# Patient Record
Sex: Female | Born: 1945 | Race: White | Hispanic: No | Smoking: Never smoker
Health system: Southern US, Community
[De-identification: ages and names within clinical notes are randomized; demographics above are authoritative.]

## PROBLEM LIST (undated history)

## (undated) DIAGNOSIS — S82851A Displaced trimalleolar fracture of right lower leg, initial encounter for closed fracture: Secondary | ICD-10-CM

## (undated) HISTORY — PX: ROOT CANAL: SHX2363

---

## 1999-05-06 ENCOUNTER — Other Ambulatory Visit: Admission: RE | Admit: 1999-05-06 | Discharge: 1999-05-06 | Payer: Self-pay | Admitting: Obstetrics & Gynecology

## 2016-07-24 ENCOUNTER — Ambulatory Visit (INDEPENDENT_AMBULATORY_CARE_PROVIDER_SITE_OTHER): Payer: BC Managed Care – PPO

## 2016-07-24 ENCOUNTER — Ambulatory Visit (HOSPITAL_COMMUNITY)
Admission: EM | Admit: 2016-07-24 | Discharge: 2016-07-24 | Disposition: A | Discharge status: Home / Self Care | Payer: BC Managed Care – PPO | Attending: Family Medicine | Admitting: Family Medicine

## 2016-07-24 ENCOUNTER — Encounter (HOSPITAL_COMMUNITY): Payer: Self-pay | Admitting: *Deleted

## 2016-07-24 DIAGNOSIS — S82851A Displaced trimalleolar fracture of right lower leg, initial encounter for closed fracture: Secondary | ICD-10-CM

## 2016-07-24 DIAGNOSIS — S82841A Displaced bimalleolar fracture of right lower leg, initial encounter for closed fracture: Secondary | ICD-10-CM | POA: Diagnosis not present

## 2016-07-24 DIAGNOSIS — M25571 Pain in right ankle and joints of right foot: Secondary | ICD-10-CM

## 2016-07-24 NOTE — ED Notes (Signed)
Patient has 2 + pedal pulses, able to move toes

## 2016-07-24 NOTE — ED Notes (Signed)
Provided propping up of extremity and applied ice pack to right ankle

## 2016-07-24 NOTE — ED Notes (Signed)
Ortho has arrived 

## 2016-07-24 NOTE — ED Notes (Signed)
Ortho has finished.

## 2016-07-24 NOTE — ED Triage Notes (Signed)
Patient reports injuring left ankle Thursday night, used cold compression and elevation last night, woke up this am with large amount of swelling and bruising to right ankle up into right calf and right foot.

## 2016-07-24 NOTE — Discharge Instructions (Signed)
Call [facility] at [phone] on Monday. A referral was placed through the urgent care. I spoke with Dr. Dion SaucierLandau and he is expecting you.   Continue Tylenol and Motrin.  Ice is your friend.  Use crutches for getting around, try to avoid bearing weight.

## 2016-07-24 NOTE — ED Notes (Signed)
Paged Ortho... States he is heading for a trauma first and will be here after he finishes... Notified pt.

## 2016-07-24 NOTE — ED Provider Notes (Signed)
[facility]    CSN: [id number] Arrival date & time: 07/24/16  1302     History   Chief Complaint Chief Complaint  Patient presents with  . Ankle Pain    HPI Sarah Daniels is a 71 y.o. female.   HPI The patient presents with right ankle pain. 2 days ago, she was in her room and slipped on her hardwood floor. She hit the outside of her right ankle on the metal bed frame. She had some initial pain, however was able to sleep. Since that time, she has had continued bruising and swelling. She is able to walk with a limp. She has been using aspirin and Tylenol for pain control. She is having no issues sleeping. She has no numbness or tingling.  History reviewed. No pertinent past medical history.  History reviewed. No pertinent surgical history.   Home Medications    Takes no medications routinely.  Family History History reviewed. No pertinent family history.  Social History Social History  Substance Use Topics  . Smoking status: Never Smoker  . Smokeless tobacco: Never Used  . Alcohol use Yes     Comment: occ     Allergies   Patient has no known allergies.   Review of Systems Review of Systems  Neurological: Negative for numbness.  MSK: +R ankle pain and swelling   Physical Exam Triage Vital Signs ED Triage Vitals  Enc Vitals Group     BP 07/24/16 1354 (!) 149/89     Pulse Rate 07/24/16 1354 93     Resp 07/24/16 1354 14     Temp 07/24/16 1354 99 F (37.2 C)     Temp Source 07/24/16 1354 Oral     SpO2 07/24/16 1354 95 %     Pain Score 07/24/16 1349 8   Updated Vital Signs BP (!) 149/89 (BP Location: Left Arm) Comment: notified rn  Pulse 93   Temp 99 F (37.2 C) (Oral)   Resp 14   SpO2 95%   Physical Exam  Constitutional: She is oriented to person, place, and time. She appears well-developed and well-nourished.  HENT:  Head: Normocephalic and atraumatic.  Nose: Nose normal.  Eyes: EOM are normal. Pupils are equal, round, and  reactive to light.  Cardiovascular: Normal rate, regular rhythm and intact distal pulses.   No murmur heard. Pulmonary/Chest: Effort normal and breath sounds normal. No respiratory distress.  Neurological: She is alert and oriented to person, place, and time. No sensory deficit.  Skin: Skin is warm and dry. Capillary refill takes less than 2 seconds. No erythema.  Psychiatric: She has a normal mood and affect. Judgment normal.  MSK: TTP over medial and lateral malleolus, +soft tissue swelling about the ankle, +decreased ROM, antalgic gait   UC Treatments / Results  Radiology Dg Ankle Complete Right  Result Date: 07/24/2016 CLINICAL DATA:  Slipped on hardwood floor hitting ankle and foot against bed frame, now with pain and swelling. EXAM: RIGHT ANKLE - COMPLETE 3+ VIEW COMPARISON:  None. FINDINGS: Minimally displaced trimalleolar ankle fractures, all of which appear to demonstrate intra-articular extension. No significant displacement. Small expected joint effusion. Expected adjacent soft tissue swelling. No radiopaque foreign body. Note is made is of a small os peroneus. IMPRESSION: Minimally displaced trimalleolar ankle fracture with intra-articular extension. Electronically Signed   By: Simonne Come M.D.   On: 07/24/2016 14:14    Procedures Procedures - none  Initial Impression / Assessment and Plan / UC Course  I have  reviewed the triage vital signs and the nursing notes.  Pertinent labs & imaging results that were available during my care of the patient were reviewed by me and considered in my medical decision making (see chart for details).     71 year old female presents with a right ankle injury. X-ray as noted above. She was placed in a posterior splint and given crutches. I checked the splint personally and ensured that she is still neurovascularly intact and the splint is not too tight. I did speak with the on-call orthopedic physician, Dr. Dion SaucierLandau, of [facility].  He will be in the office on Monday and will see the patient. The referral has been placed today. The number and hours were given to the patient and after visit summary. I offered stronger pain medication, however she states that she is content with aspirin and Tylenol. Encourage continued use of icing. Try to avoid bearing weight. Follow-up if new symptoms arise. The patient voiced understanding and agreement to the plan.  Final Clinical Impressions(s) / UC Diagnoses   Final diagnoses:  Closed trimalleolar fracture of right ankle, initial encounter      Sharlene Doryicholas Paul Wendling, DO 07/24/16 1702

## 2016-07-24 NOTE — Progress Notes (Signed)
Orthopedic Tech Progress Note Patient Details:  Sarah Daniels Nov 05, 1945 [id number]  Ortho Devices Type of Ortho Device: Ace wrap, Crutches, Post (short leg) splint Ortho Device/Splint Location: rle Ortho Device/Splint Interventions: Application   [name] 07/24/2016, 3:47 PM

## 2016-07-26 ENCOUNTER — Other Ambulatory Visit: Payer: Self-pay | Admitting: Orthopedic Surgery

## 2016-07-26 ENCOUNTER — Encounter (HOSPITAL_BASED_OUTPATIENT_CLINIC_OR_DEPARTMENT_OTHER): Payer: Self-pay | Admitting: *Deleted

## 2016-07-28 ENCOUNTER — Encounter (HOSPITAL_BASED_OUTPATIENT_CLINIC_OR_DEPARTMENT_OTHER)
Admission: RE | Disposition: A | Discharge status: Home / Self Care | Payer: Self-pay | Source: Ambulatory Visit | Attending: Orthopedic Surgery

## 2016-07-28 ENCOUNTER — Ambulatory Visit (HOSPITAL_BASED_OUTPATIENT_CLINIC_OR_DEPARTMENT_OTHER): Payer: BC Managed Care – PPO | Admitting: Anesthesiology

## 2016-07-28 ENCOUNTER — Ambulatory Visit (HOSPITAL_BASED_OUTPATIENT_CLINIC_OR_DEPARTMENT_OTHER)
Admission: RE | Admit: 2016-07-28 | Discharge: 2016-07-29 | Disposition: A | Discharge status: Home / Self Care | Payer: BC Managed Care – PPO | Source: Ambulatory Visit | Attending: Orthopedic Surgery | Admitting: Orthopedic Surgery

## 2016-07-28 ENCOUNTER — Encounter (HOSPITAL_BASED_OUTPATIENT_CLINIC_OR_DEPARTMENT_OTHER): Payer: Self-pay | Admitting: Anesthesiology

## 2016-07-28 DIAGNOSIS — W010XXA Fall on same level from slipping, tripping and stumbling without subsequent striking against object, initial encounter: Secondary | ICD-10-CM | POA: Diagnosis not present

## 2016-07-28 DIAGNOSIS — S82851A Displaced trimalleolar fracture of right lower leg, initial encounter for closed fracture: Secondary | ICD-10-CM | POA: Diagnosis present

## 2016-07-28 DIAGNOSIS — Z7982 Long term (current) use of aspirin: Secondary | ICD-10-CM | POA: Insufficient documentation

## 2016-07-28 HISTORY — PX: ORIF ANKLE FRACTURE: SHX5408

## 2016-07-28 HISTORY — DX: Displaced trimalleolar fracture of right lower leg, initial encounter for closed fracture: S82.851A

## 2016-07-28 SURGERY — OPEN REDUCTION INTERNAL FIXATION (ORIF) ANKLE FRACTURE
Anesthesia: Monitor Anesthesia Care | Site: Ankle | Laterality: Right

## 2016-07-28 MED ORDER — LIDOCAINE 2% (20 MG/ML) 5 ML SYRINGE
INTRAMUSCULAR | Status: AC
  Filled 2016-07-28: qty 5

## 2016-07-28 MED ORDER — PROMETHAZINE HCL 25 MG/ML IJ SOLN: 6.2500 mg | INTRAMUSCULAR | Status: DC | PRN

## 2016-07-28 MED ORDER — ONDANSETRON HCL 4 MG/2ML IJ SOLN
INTRAMUSCULAR | Status: DC | PRN
  Administered 2016-07-28: 4 mg via INTRAVENOUS

## 2016-07-28 MED ORDER — OXYCODONE HCL 5 MG PO TABS: 5.0000 mg | ORAL_TABLET | ORAL | Status: DC | PRN

## 2016-07-28 MED ORDER — SENNA-DOCUSATE SODIUM 8.6-50 MG PO TABS: 2.0000 | ORAL_TABLET | Freq: Every day | ORAL | 1 refills | Status: DC

## 2016-07-28 MED ORDER — PROPOFOL 500 MG/50ML IV EMUL
INTRAVENOUS | Status: AC
  Filled 2016-07-28: qty 50

## 2016-07-28 MED ORDER — MIDAZOLAM HCL 2 MG/2ML IJ SOLN
1.0000 mg | INTRAMUSCULAR | Status: DC | PRN
  Administered 2016-07-28: 2 mg via INTRAVENOUS

## 2016-07-28 MED ORDER — POLYETHYLENE GLYCOL 3350 17 G PO PACK: 17.0000 g | PACK | Freq: Every day | ORAL | Status: DC | PRN

## 2016-07-28 MED ORDER — SODIUM CHLORIDE 0.9 % IV SOLN
INTRAVENOUS | Status: DC
  Administered 2016-07-28: 15:00:00 via INTRAVENOUS

## 2016-07-28 MED ORDER — HYDROMORPHONE HCL 1 MG/ML IJ SOLN: 0.5000 mg | INTRAMUSCULAR | Status: DC | PRN

## 2016-07-28 MED ORDER — MIDAZOLAM HCL 2 MG/2ML IJ SOLN
INTRAMUSCULAR | Status: AC
  Filled 2016-07-28: qty 2

## 2016-07-28 MED ORDER — SENNA 8.6 MG PO TABS
1.0000 | ORAL_TABLET | Freq: Two times a day (BID) | ORAL | Status: DC
Start: 2016-07-28 — End: 2016-07-29
  Administered 2016-07-28: 8.6 mg via ORAL
  Filled 2016-07-28: qty 1

## 2016-07-28 MED ORDER — SCOPOLAMINE 1 MG/3DAYS TD PT72: 1.0000 | MEDICATED_PATCH | Freq: Once | TRANSDERMAL | Status: DC | PRN

## 2016-07-28 MED ORDER — LIDOCAINE HCL (CARDIAC) 20 MG/ML IV SOLN
INTRAVENOUS | Status: DC | PRN
  Administered 2016-07-28: 10 mg via INTRAVENOUS

## 2016-07-28 MED ORDER — BISACODYL 10 MG RE SUPP: 10.0000 mg | Freq: Every day | RECTAL | Status: DC | PRN

## 2016-07-28 MED ORDER — OXYCODONE-ACETAMINOPHEN 5-325 MG PO TABS: 1.0000 | ORAL_TABLET | ORAL | Status: DC | PRN

## 2016-07-28 MED ORDER — CEFAZOLIN SODIUM-DEXTROSE 2-4 GM/100ML-% IV SOLN
2.0000 g | INTRAVENOUS | Status: AC
  Administered 2016-07-28: 2 g via INTRAVENOUS

## 2016-07-28 MED ORDER — BUPIVACAINE HCL (PF) 0.5 % IJ SOLN
INTRAMUSCULAR | Status: AC
  Filled 2016-07-28: qty 30

## 2016-07-28 MED ORDER — ONDANSETRON HCL 4 MG PO TABS: 4.0000 mg | ORAL_TABLET | Freq: Four times a day (QID) | ORAL | Status: DC | PRN

## 2016-07-28 MED ORDER — FENTANYL CITRATE (PF) 100 MCG/2ML IJ SOLN
INTRAMUSCULAR | Status: AC
  Filled 2016-07-28: qty 2

## 2016-07-28 MED ORDER — LACTATED RINGERS IV SOLN
INTRAVENOUS | Status: DC
  Administered 2016-07-28 (×2): via INTRAVENOUS

## 2016-07-28 MED ORDER — DEXAMETHASONE SODIUM PHOSPHATE 10 MG/ML IJ SOLN
INTRAMUSCULAR | Status: AC
  Filled 2016-07-28: qty 1

## 2016-07-28 MED ORDER — DOCUSATE SODIUM 100 MG PO CAPS
100.0000 mg | ORAL_CAPSULE | Freq: Two times a day (BID) | ORAL | Status: DC
  Administered 2016-07-28: 100 mg via ORAL
  Filled 2016-07-28: qty 1

## 2016-07-28 MED ORDER — CEFAZOLIN IN D5W 1 GM/50ML IV SOLN
1.0000 g | Freq: Four times a day (QID) | INTRAVENOUS | Status: AC
  Administered 2016-07-28 – 2016-07-29 (×3): 1 g via INTRAVENOUS
  Filled 2016-07-28 (×3): qty 50

## 2016-07-28 MED ORDER — PROPOFOL 10 MG/ML IV BOLUS
INTRAVENOUS | Status: AC
  Filled 2016-07-28: qty 20

## 2016-07-28 MED ORDER — ONDANSETRON HCL 4 MG PO TABS: 4.0000 mg | ORAL_TABLET | Freq: Three times a day (TID) | ORAL | 0 refills | Status: DC | PRN

## 2016-07-28 MED ORDER — MIDAZOLAM HCL 5 MG/5ML IJ SOLN
INTRAMUSCULAR | Status: DC | PRN
  Administered 2016-07-28: 2 mg via INTRAVENOUS

## 2016-07-28 MED ORDER — METHOCARBAMOL 1000 MG/10ML IJ SOLN: 500.0000 mg | Freq: Four times a day (QID) | INTRAVENOUS | Status: DC | PRN

## 2016-07-28 MED ORDER — PROPOFOL 10 MG/ML IV BOLUS
INTRAVENOUS | Status: DC | PRN
  Administered 2016-07-28 (×4): 30 mg via INTRAVENOUS

## 2016-07-28 MED ORDER — MAGNESIUM CITRATE PO SOLN
1.0000 | Freq: Once | ORAL | Status: DC | PRN
Start: 2016-07-28 — End: 2016-07-29

## 2016-07-28 MED ORDER — ONDANSETRON HCL 4 MG/2ML IJ SOLN
INTRAMUSCULAR | Status: AC
  Filled 2016-07-28: qty 2

## 2016-07-28 MED ORDER — METOCLOPRAMIDE HCL 5 MG/ML IJ SOLN: 5.0000 mg | Freq: Three times a day (TID) | INTRAMUSCULAR | Status: DC | PRN

## 2016-07-28 MED ORDER — METHOCARBAMOL 500 MG PO TABS: 500.0000 mg | ORAL_TABLET | Freq: Four times a day (QID) | ORAL | Status: DC | PRN

## 2016-07-28 MED ORDER — FENTANYL CITRATE (PF) 100 MCG/2ML IJ SOLN
50.0000 ug | INTRAMUSCULAR | Status: DC | PRN
  Administered 2016-07-28: 100 ug via INTRAVENOUS

## 2016-07-28 MED ORDER — ONDANSETRON HCL 4 MG/2ML IJ SOLN: 4.0000 mg | Freq: Four times a day (QID) | INTRAMUSCULAR | Status: DC | PRN

## 2016-07-28 MED ORDER — 0.9 % SODIUM CHLORIDE (POUR BTL) OPTIME
TOPICAL | Status: DC | PRN
  Administered 2016-07-28: 1000 mL

## 2016-07-28 MED ORDER — HYDROMORPHONE HCL 1 MG/ML IJ SOLN: 0.2500 mg | INTRAMUSCULAR | Status: DC | PRN

## 2016-07-28 MED ORDER — FENTANYL CITRATE (PF) 100 MCG/2ML IJ SOLN
INTRAMUSCULAR | Status: DC | PRN
  Administered 2016-07-28 (×2): 50 ug via INTRAVENOUS
  Administered 2016-07-28 (×2): 25 ug via INTRAVENOUS

## 2016-07-28 MED ORDER — PROPOFOL 500 MG/50ML IV EMUL
INTRAVENOUS | Status: DC | PRN
  Administered 2016-07-28: 25 ug/kg/min via INTRAVENOUS

## 2016-07-28 MED ORDER — BUPIVACAINE-EPINEPHRINE (PF) 0.5% -1:200000 IJ SOLN
INTRAMUSCULAR | Status: DC | PRN
  Administered 2016-07-28: 15 mL via PERINEURAL
  Administered 2016-07-28: 25 mL via PERINEURAL

## 2016-07-28 MED ORDER — OXYCODONE-ACETAMINOPHEN 5-325 MG PO TABS: 1.0000 | ORAL_TABLET | Freq: Four times a day (QID) | ORAL | 0 refills | Status: DC | PRN

## 2016-07-28 MED ORDER — METOCLOPRAMIDE HCL 5 MG PO TABS: 5.0000 mg | ORAL_TABLET | Freq: Three times a day (TID) | ORAL | Status: DC | PRN

## 2016-07-28 MED ORDER — ZOLPIDEM TARTRATE 5 MG PO TABS: 5.0000 mg | ORAL_TABLET | Freq: Every evening | ORAL | Status: DC | PRN

## 2016-07-28 MED ORDER — CEFAZOLIN SODIUM-DEXTROSE 2-4 GM/100ML-% IV SOLN
INTRAVENOUS | Status: AC
  Filled 2016-07-28: qty 100

## 2016-07-28 SURGICAL SUPPLY — 79 items
4.0 X 40 CANNULATED SCREW (Screw) ×2 IMPLANT
BANDAGE ACE 4X5 VEL STRL LF (GAUZE/BANDAGES/DRESSINGS) ×3 IMPLANT
BANDAGE ACE 6X5 VEL STRL LF (GAUZE/BANDAGES/DRESSINGS) ×3 IMPLANT
BANDAGE ESMARK 6X9 LF (GAUZE/BANDAGES/DRESSINGS) ×1 IMPLANT
BIT DRILL 110X2.5XQCK CNCT (BIT) ×1 IMPLANT
BIT DRILL 2.5 (BIT) ×3
BIT DRILL 2.7XCANN QCK CNCT (BIT) IMPLANT
BIT DRILL CANN 2.7 (BIT) ×2
BIT DRILL CANN 2.7MM (BIT) ×1
BIT DRL 110X2.5XQCK CNCT (BIT) ×1
BIT DRL 2.7XCANN QCK CNCT (BIT) ×1
BLADE SURG 15 STRL LF DISP TIS (BLADE) ×3 IMPLANT
BLADE SURG 15 STRL SS (BLADE) ×6
BNDG CMPR 9X6 STRL LF SNTH (GAUZE/BANDAGES/DRESSINGS) ×1
BNDG COHESIVE 4X5 TAN STRL (GAUZE/BANDAGES/DRESSINGS) ×3 IMPLANT
BNDG ESMARK 6X9 LF (GAUZE/BANDAGES/DRESSINGS) ×3
CANISTER SUCT 1200ML W/VALVE (MISCELLANEOUS) ×3 IMPLANT
CLOSURE STERI-STRIP 1/2X4 (GAUZE/BANDAGES/DRESSINGS) ×1
CLSR STERI-STRIP ANTIMIC 1/2X4 (GAUZE/BANDAGES/DRESSINGS) ×1 IMPLANT
COVER BACK TABLE 60X90IN (DRAPES) ×3 IMPLANT
CUFF TOURNIQUET SINGLE 34IN LL (TOURNIQUET CUFF) ×2 IMPLANT
DECANTER SPIKE VIAL GLASS SM (MISCELLANEOUS) IMPLANT
DRAPE C-ARM 42X72 X-RAY (DRAPES) IMPLANT
DRAPE C-ARMOR (DRAPES) IMPLANT
DRAPE EXTREMITY T 121X128X90 (DRAPE) ×3 IMPLANT
DRAPE IMP U-DRAPE 54X76 (DRAPES) ×3 IMPLANT
DRAPE INCISE IOBAN 66X45 STRL (DRAPES) ×3 IMPLANT
DRAPE OEC MINIVIEW 54X84 (DRAPES) ×2 IMPLANT
DRAPE U-SHAPE 47X51 STRL (DRAPES) ×3 IMPLANT
DRSG ADAPTIC 3X8 NADH LF (GAUZE/BANDAGES/DRESSINGS) ×3 IMPLANT
DRSG PAD ABDOMINAL 8X10 ST (GAUZE/BANDAGES/DRESSINGS) ×2 IMPLANT
DURAPREP 26ML APPLICATOR (WOUND CARE) ×3 IMPLANT
ELECT REM PT RETURN 9FT ADLT (ELECTROSURGICAL) ×3
ELECTRODE REM PT RTRN 9FT ADLT (ELECTROSURGICAL) ×1 IMPLANT
GAUZE SPONGE 4X4 12PLY STRL (GAUZE/BANDAGES/DRESSINGS) ×1 IMPLANT
GLOVE BIO SURGEON STRL SZ7 (GLOVE) ×2 IMPLANT
GLOVE BIO SURGEON STRL SZ8 (GLOVE) ×3 IMPLANT
GLOVE BIOGEL M 6.5 STRL (GLOVE) ×2 IMPLANT
GLOVE BIOGEL PI IND STRL 8 (GLOVE) ×2 IMPLANT
GLOVE BIOGEL PI INDICATOR 8 (GLOVE) ×6
GLOVE ORTHO TXT STRL SZ7.5 (GLOVE) ×3 IMPLANT
GOWN STRL REUS W/ TWL LRG LVL3 (GOWN DISPOSABLE) ×1 IMPLANT
GOWN STRL REUS W/ TWL XL LVL3 (GOWN DISPOSABLE) ×2 IMPLANT
GOWN STRL REUS W/TWL LRG LVL3 (GOWN DISPOSABLE) ×3
GOWN STRL REUS W/TWL XL LVL3 (GOWN DISPOSABLE) ×6
GUIDEWIRE PIN ORTH 6X1.6XSMTH (WIRE) ×2 IMPLANT
K-WIRE 1.6 (WIRE) ×6
NEEDLE HYPO 25X1 1.5 SAFETY (NEEDLE) IMPLANT
NS IRRIG 1000ML POUR BTL (IV SOLUTION) ×3 IMPLANT
PACK BASIN DAY SURGERY FS (CUSTOM PROCEDURE TRAY) ×3 IMPLANT
PAD CAST 4YDX4 CTTN HI CHSV (CAST SUPPLIES) ×2 IMPLANT
PADDING CAST COTTON 4X4 STRL (CAST SUPPLIES)
PENCIL BUTTON HOLSTER BLD 10FT (ELECTRODE) ×3 IMPLANT
SCREW CANN 1/3 THRD RVRS CT (Screw) IMPLANT
SCREW CANNULATED 4.0X40 (Screw) ×3 IMPLANT
SCREW CORTICAL 3.5X26 (Screw) ×2 IMPLANT
SHEET MEDIUM DRAPE 40X70 STRL (DRAPES) IMPLANT
SLEEVE SCD COMPRESS KNEE MED (MISCELLANEOUS) ×3 IMPLANT
SPLINT FAST PLASTER 5X30 (CAST SUPPLIES)
SPLINT PLASTER CAST FAST 5X30 (CAST SUPPLIES) IMPLANT
SPONGE LAP 4X18 X RAY DECT (DISPOSABLE) ×3 IMPLANT
STAPLER VISISTAT 35W (STAPLE) IMPLANT
SUCTION FRAZIER HANDLE 10FR (MISCELLANEOUS) ×2
SUCTION TUBE FRAZIER 10FR DISP (MISCELLANEOUS) ×1 IMPLANT
SUT ETHILON 3 0 PS 1 (SUTURE) IMPLANT
SUT ETHILON 4 0 PS 2 18 (SUTURE) IMPLANT
SUT MNCRL AB 4-0 PS2 18 (SUTURE) IMPLANT
SUT VIC AB 0 CT1 27 (SUTURE)
SUT VIC AB 0 CT1 27XBRD ANBCTR (SUTURE) IMPLANT
SUT VIC AB 2-0 SH 18 (SUTURE) IMPLANT
SUT VIC AB 3-0 SH 27 (SUTURE)
SUT VIC AB 3-0 SH 27X BRD (SUTURE) IMPLANT
SUT VICRYL 3-0 CR8 SH (SUTURE) IMPLANT
SYR BULB 3OZ (MISCELLANEOUS) ×3 IMPLANT
SYR CONTROL 10ML LL (SYRINGE) IMPLANT
TUBE CONNECTING 20'X1/4 (TUBING) ×1
TUBE CONNECTING 20X1/4 (TUBING) ×2 IMPLANT
UNDERPAD 30X30 (UNDERPADS AND DIAPERS) ×3 IMPLANT
YANKAUER SUCT BULB TIP NO VENT (SUCTIONS) ×3 IMPLANT

## 2016-07-28 NOTE — Op Note (Signed)
07/28/2016  PATIENT:  Sarah Daniels    PRE-OPERATIVE DIAGNOSIS:  Right trimalleolar fracture  POST-OPERATIVE DIAGNOSIS:  Same  PROCEDURE:  OPEN REDUCTION INTERNAL FIXATION (ORIF) RIGHT TRIMALLEOLAR FRACTURE WITHOUT FIXATION OF THE POSTERIOR LIP  X-ray evaluation of right ankle, 3 views with an intraoperative stress view: These demonstrate anatomic alignment with stable syndesmosis and fixation of the fibula and medial malleolus.  SURGEON:  Eulas Post, MD  PHYSICIAN ASSISTANT: Janace Litten, OPA-C, present and scrubbed throughout the case, critical for completion in a timely fashion, and for retraction, instrumentation, and closure.  ANESTHESIA:   Regional  ESTIMATED BLOOD LOSS: Minimal  PREOPERATIVE INDICATIONS:  Sarah Daniels is a  71 y.o. female with a diagnosis of Right trimalleolar fracture who elected for surgical management to minimize the risk for malunion and nonunion and post-traumatic arthritis.    The risks benefits and alternatives were discussed with the patient preoperatively including but not limited to the risks of infection, bleeding, nerve injury, cardiopulmonary complications, the need for revision surgery, the need for hardware removal, among others, and the patient was willing to proceed.  OPERATIVE IMPLANTS: 4.0 mm cannulated screw with a washer size 60 mm for the fibula with a 26 mm interfragmentary lag screw and a single 4.0 mm cannulated screw for the medial malleolus.  OPERATIVE PROCEDURE: The patient was brought to the operating room and placed in the supine position. All bony prominences were padded. Regional anesthesia was administered with a popliteal and saphenous nerve block. The lower extremity was prepped and draped in the usual sterile fashion. The leg was elevated and exsanguinated and the tourniquet was inflated. Time out was performed.   Incision was made over the distal fibula and the fracture was exposed and reduced anatomically with a  clamp. The bone quality was extremely poor. The fracture was extremely distal. A standard one third tubular plate would not have provided any fixation. I considered using a hook plate, although I was concerned about hardware prominence, additionally there was significant comminution at the fibular fracture, and I elected to utilize a cancellus partially threaded screw with a washer up the fibula. This had satisfactory fixation, the bone quality was extremely poor. I confirmed anatomic reduction by C-arm and direct visualization. There was some posterior comminution as well which was ignored.  I then turned my attention to the medial malleolus. Incision was made over the medial malleolus and the fracture exposed and held provisionally with a clamp. 2 guidepins were placed for the 4.0 mm cannulated screws and then confirmation of reduction was made with fluoroscopy. I then placed the anterior screw, which was in the best quality bone going up the anterior column, again there was a fair amount of comminution at the fracture site and poor quality bone. I inspected the posterior guidepin, but it was actually penetrating the posterior tibial tendon which was immediately adjacent to the bone, and therefore I abandoned placing a posterior screw, there really was not much room for a second screw, and I had already placed the anterior screw in the best bone possible..   The syndesmosis was stressed using live fluoroscopy and found to be stable.   The wounds were irrigated, and closed with vicryl with routine closure for the skin. Sterile gauze was applied followed by a posterior splint. She was awakened and returned to the PACU in stable and satisfactory condition. There were no complications. We have had a in-depth discussions about the need to be nonweightbearing, although she has effectively declined  the use of a knee scooter, and says that she is not capable of being nonweightbearing, and we will just have to simply  do the best we can. We will admit her overnight for pain control and monitoring and hopefully crutch education/walker education to minimize weightbearing, which could potentially lead to failure of the construct.

## 2016-07-28 NOTE — H&P (Signed)
PREOPERATIVE H&P  Chief Complaint: right trimalleolar fracture  HPI: Sarah Daniels is a 71 y.o. female who presents for preoperative history and physical with a diagnosis of right trimalleolar fracture. Symptoms are rated as moderate to severe, and have been worsening.  This is significantly impairing activities of daily living.  She has elected for surgical management.   Injury was last Thursday. Slipped on hardwood floor.  Using tylenol and aspirin for pain.   Past Medical History:  Diagnosis Date  . Trimalleolar fracture of ankle, closed, right, initial encounter    Past Surgical History:  Procedure Laterality Date  . ROOT CANAL     Social History   Social History  . Marital status: Unknown    Spouse name: N/A  . Number of children: N/A  . Years of education: N/A   Social History Main Topics  . Smoking status: Never Smoker  . Smokeless tobacco: Never Used  . Alcohol use Yes     Comment: drinks wine daily with dinner  . Drug use: No  . Sexual activity: Not Asked   Other Topics Concern  . None   Social History Narrative  . None   History reviewed. No pertinent family history. Allergies  Allergen Reactions  . Carbocaine [Mepivacaine Hcl] Hives    As a child (thinks it was hives but not sure)   Prior to Admission medications   Medication Sig Start Date End Date Taking? Authorizing Provider  aspirin 325 MG tablet Take 325 mg by mouth daily.   Yes Historical Provider, MD  acetaminophen (TYLENOL) 325 MG tablet Take 650 mg by mouth every 6 (six) hours as needed.    Historical Provider, MD     Positive ROS: All other systems have been reviewed and were otherwise negative with the exception of those mentioned in the HPI and as above.  Physical Exam: General: Alert, no acute distress Cardiovascular: No pedal edema Respiratory: No cyanosis, no use of accessory musculature GI: No organomegaly, abdomen is soft and non-tender Skin: No lesions in the area of chief  complaint Neurologic: Sensation intact distally Psychiatric: Patient is competent for consent with normal mood and affect Lymphatic: No axillary or cervical lymphadenopathy  MUSCULOSKELETAL: right ankle has some soft tissue swelling, pain to palpation diffusely, EHL intact. Sensation intact.  Assessment: right trimalleolar fracture   Plan: Plan for Procedure(s): OPEN REDUCTION INTERNAL FIXATION (ORIF) RIGHT TRIMALLEOLAR FRACTURE without fixation of posterior lip.    The risks benefits and alternatives were discussed with the patient including but not limited to the risks of nonoperative treatment, versus surgical intervention including infection, bleeding, nerve injury, malunion, nonunion, the need for revision surgery, hardware prominence, hardware failure, the need for hardware removal, blood clots, cardiopulmonary complications, morbidity, mortality, among others, and they were willing to proceed.     Eulas PostLANDAU,Anilah Huck P, MD Cell [phone]   07/28/2016 12:32 PM

## 2016-07-28 NOTE — Anesthesia Preprocedure Evaluation (Addendum)
Anesthesia Evaluation  Patient identified by MRN, date of birth, ID band Patient awake    Reviewed: Allergy & Precautions, NPO status , Patient's Chart, lab work & pertinent test results  History of Anesthesia Complications Negative for: history of anesthetic complications  Airway Mallampati: II  TM Distance: >3 FB Neck ROM: Full    Dental no notable dental hx. (+) Dental Advisory Given   Pulmonary neg pulmonary ROS,    Pulmonary exam normal        Cardiovascular negative cardio ROS Normal cardiovascular exam     Neuro/Psych negative neurological ROS  negative psych ROS   GI/Hepatic negative GI ROS, Neg liver ROS,   Endo/Other  negative endocrine ROS  Renal/GU negative Renal ROS  negative genitourinary   Musculoskeletal   Abdominal   Peds negative pediatric ROS (+)  Hematology negative hematology ROS (+)   Anesthesia Other Findings   Reproductive/Obstetrics negative OB ROS                            Anesthesia Physical Anesthesia Plan  ASA: II  Anesthesia Plan: MAC   Post-op Pain Management: GA combined w/ Regional for post-op pain   Induction:   Airway Management Planned: Natural Airway and Simple Face Mask  Additional Equipment:   Intra-op Plan:   Post-operative Plan:   Informed Consent: I have reviewed the patients History and Physical, chart, labs and discussed the procedure including the risks, benefits and alternatives for the proposed anesthesia with the patient or authorized representative who has indicated his/her understanding and acceptance.   Dental advisory given  Plan Discussed with: CRNA, Anesthesiologist and Surgeon  Anesthesia Plan Comments:        Anesthesia Quick Evaluation

## 2016-07-28 NOTE — Transfer of Care (Signed)
Immediate Anesthesia Transfer of Care Note  Patient: Sarah Daniels  Procedure(s) Performed: Procedure(s): OPEN REDUCTION INTERNAL FIXATION (ORIF) RIGHT TRIMALLEOLAR FRACTURE WITHOUT FIXATION OF THE POSTERIOR LIP (Right)  Patient Location: PACU  Anesthesia Type:MAC combined with regional for post-op pain  Level of Consciousness: awake and patient cooperative  Airway & Oxygen Therapy: Patient Spontanous Breathing and Patient connected to face mask oxygen  Post-op Assessment: Report given to RN and Post -op Vital signs reviewed and stable  Post vital signs: Reviewed and stable  Last Vitals:  Vitals:   07/28/16 1230 07/28/16 1436  BP: 139/76 137/76  Pulse: 96 87  Resp: 17 15  Temp:      Last Pain:  Vitals:   07/28/16 1436  TempSrc:   PainSc: (P) 0-No pain      Patients Stated Pain Goal: 2 (46/50/35 4656)  Complications: No apparent anesthesia complications

## 2016-07-28 NOTE — Progress Notes (Signed)
Assisted Dr. Krista BlueSinger with right, ultrasound guided, adductor canal and popliteal block. Side rails up, monitors on throughout procedure. See vital signs in flow sheet. Tolerated Procedure well.

## 2016-07-28 NOTE — Anesthesia Postprocedure Evaluation (Addendum)
Anesthesia Post Note  Patient: Sarah Daniels  Procedure(s) Performed: Procedure(s) (LRB): OPEN REDUCTION INTERNAL FIXATION (ORIF) RIGHT TRIMALLEOLAR FRACTURE WITHOUT FIXATION OF THE POSTERIOR LIP (Right)  Patient location during evaluation: PACU Anesthesia Type: MAC Level of consciousness: awake and alert Pain management: pain level controlled Vital Signs Assessment: post-procedure vital signs reviewed and stable Respiratory status: spontaneous breathing and respiratory function stable Cardiovascular status: stable Anesthetic complications: no       Last Vitals:  Vitals:   07/28/16 1445 07/28/16 1450  BP: 138/81   Pulse: 87 85  Resp: 14 17  Temp:      Last Pain:  Vitals:   07/28/16 1450  TempSrc:   PainSc: 0-No pain        RLE Motor Response: No movement due to regional block (07/28/16 1450) RLE Sensation: Decreased;No pain (07/28/16 1450)      [place]

## 2016-07-28 NOTE — OR Nursing (Signed)
When this writer went to interview patient, the patient had multiple concerns about postoperative instructions and stated that Dr Dion SaucierLandau had told her she needed to be non-weight bearing.  I reinforced this with her and she stated that she was currently moving around her home with a walker and using a cane sometimes when the layout was not conducive to use of the walker.  She stated that she was "just putting a little weight on [her] heel".  I explained that was not a good alternative because she would be moving her ankle and putting stress on it.  I stated that Dr Dion SaucierLandau was making recommendations to her based on his experience and education.  The patient began crying and stated that she felt as though she was "being scolded".  I told her how sorry I was that she felt that way, and reinforced that I was not trying to scold her or make her feel as though she was a "bad patient".  She covered her face with her hands and began sobbing.  Craige CottaM. Linka, CRNA was present for the majority of the conversation and had discussed with the patient some of the strategies that she employed when she had a similar type of surgery.  At that point, she asked the patient if she needed a moment to herself.  The patient nodded and we stepped out of the room.  Several minutes later, M. Linka went back to check on the patient and after a brief interchange, she and the patient wheeled out of the cubicle for the OR room.  I was giving a report to J. Burroughs at that time and we proceeded to transport the patient to the OR suite, with no further issues.

## 2016-07-28 NOTE — Anesthesia Procedure Notes (Signed)
Anesthesia Regional Block: Popliteal block   Pre-Anesthetic Checklist: ,, timeout performed, Correct Patient, Correct Site, Correct Laterality, Correct Procedure, Correct Position, site marked, Risks and benefits discussed,  Surgical consent,  Pre-op evaluation,  At surgeon's request and post-op pain management  Laterality: Right  Prep: chloraprep       Needles:  Injection technique: Single-shot  Needle Type: Echogenic Stimulator Needle          Additional Needles:   Procedures: ultrasound guided,,,,,,,,  Narrative:  Start time: 07/28/2016 12:14 PM End time: 07/28/2016 12:24 PM Injection made incrementally with aspirations every 5 mL.  Performed by: Personally  Anesthesiologist: Heather RobertsSINGER, Daveda Larock  Additional Notes: A functioning IV was confirmed and monitors were applied.  Sterile prep and drape, hand hygiene and sterile gloves were used.  Negative aspiration and test dose prior to incremental administration of local anesthetic. The patient tolerated the procedure well.Ultrasound  guidance: relevant anatomy identified, needle position confirmed, local anesthetic spread visualized around nerve(s), vascular puncture avoided.  Image printed for medical record. Adductor canal block performed.

## 2016-07-28 NOTE — Discharge Instructions (Signed)
Diet: As you were doing prior to hospitalization   Shower:  May shower but keep the wounds dry, use an occlusive plastic wrap, NO SOAKING IN TUB.  If the bandage gets wet, change with a clean dry gauze.  If you have a splint on, leave the splint in place and keep the splint dry with a plastic bag.  Dressing:  You may change your dressing 3-5 days after surgery, unless you have a splint.  If you have a splint, then just leave the splint in place and we will change your bandages during your first follow-up appointment.    If you had hand or foot surgery, we will plan to remove your stitches in about 2 weeks in the office.  For all other surgeries, there are sticky tapes (steri-strips) on your wounds and all the stitches are absorbable.  Leave the steri-strips in place when changing your dressings, they will peel off with time, usually 2-3 weeks.  Activity:  Increase activity slowly as tolerated, but follow the weight bearing instructions below.  The rules on driving is that you can not be taking narcotics while you drive, and you must feel in control of the vehicle.    Weight Bearing:   Non-weightbearing right lower extremity.    To prevent constipation: you may use a stool softener such as -  Colace (over the counter) 100 mg by mouth twice a day  Drink plenty of fluids (prune juice may be helpful) and high fiber foods Miralax (over the counter) for constipation as needed.    Itching:  If you experience itching with your medications, try taking only a single pain pill, or even half a pain pill at a time.  You may take up to 10 pain pills per day, and you can also use benadryl over the counter for itching or also to help with sleep.   Precautions:  If you experience chest pain or shortness of breath - call 911 immediately for transfer to the hospital emergency department!!  If you develop a fever greater that 101 F, purulent drainage from wound, increased redness or drainage from wound, or calf  pain -- Call the office at [phone]                                                Follow- Up Appointment:  Please call for an appointment to be seen in 2 weeks [place] - [phone]  Regional Anesthesia Blocks  1. Numbness or the inability to move the "blocked" extremity may last from 3-48 hours after placement. The length of time depends on the medication injected and your individual response to the medication. If the numbness is not going away after 48 hours, call your surgeon.  2. The extremity that is blocked will need to be protected until the numbness is gone and the  Strength has returned. Because you cannot feel it, you will need to take extra care to avoid injury. Because it may be weak, you may have difficulty moving it or using it. You may not know what position it is in without looking at it while the block is in effect.  3. For blocks in the legs and feet, returning to weight bearing and walking needs to be done carefully. You will need to wait until the numbness is entirely gone and the strength has returned. You should be  able to move your leg and foot normally before you try and bear weight or walk. You will need someone to be with you when you first try to ensure you do not fall and possibly risk injury.  4. Bruising and tenderness at the needle site are common side effects and will resolve in a few days.  5. Persistent numbness or new problems with movement should be communicated to the surgeon or the [facility] [phone] [facility] [phone]).   Post Anesthesia Home Care Instructions  Activity: Get plenty of rest for the remainder of the day. A responsible individual must stay with you for 24 hours following the procedure.  For the next 24 hours, DO NOT: -Drive a car -Advertising copywriter -Drink alcoholic beverages -Take any medication unless instructed by your physician -Make any legal decisions or sign important  papers.  Meals: Start with liquid foods such as gelatin or soup. Progress to regular foods as tolerated. Avoid greasy, spicy, heavy foods. If nausea and/or vomiting occur, drink only clear liquids until the nausea and/or vomiting subsides. Call your physician if vomiting continues.  Special Instructions/Symptoms: Your throat may feel dry or sore from the anesthesia or the breathing tube placed in your throat during surgery. If this causes discomfort, gargle with warm salt water. The discomfort should disappear within 24 hours.  If you had a scopolamine patch placed behind your ear for the management of post- operative nausea and/or vomiting:  1. The medication in the patch is effective for 72 hours, after which it should be removed.  Wrap patch in a tissue and discard in the trash. Wash hands thoroughly with soap and water. 2. You may remove the patch earlier than 72 hours if you experience unpleasant side effects which may include dry mouth, dizziness or visual disturbances. 3. Avoid touching the patch. Wash your hands with soap and water after contact with the patch.

## 2016-07-29 ENCOUNTER — Encounter (HOSPITAL_BASED_OUTPATIENT_CLINIC_OR_DEPARTMENT_OTHER): Payer: Self-pay | Admitting: Orthopedic Surgery

## 2016-07-29 DIAGNOSIS — S82851A Displaced trimalleolar fracture of right lower leg, initial encounter for closed fracture: Secondary | ICD-10-CM | POA: Diagnosis not present

## 2016-10-09 NOTE — Addendum Note (Signed)
Addendum  created 10/09/16 1101 by Vonette Grosso, MD   Sign clinical note    

## 2018-12-19 IMAGING — DX DG ANKLE COMPLETE 3+V*R*
3 series · 3 of 3 positions shown · non-contrast
Comparison: None.

CLINICAL DATA: Slipped on hardwood floor hitting ankle and foot
against bed frame, now with pain and swelling.

EXAM:
RIGHT ANKLE - COMPLETE 3+ VIEW

[ankle ap]
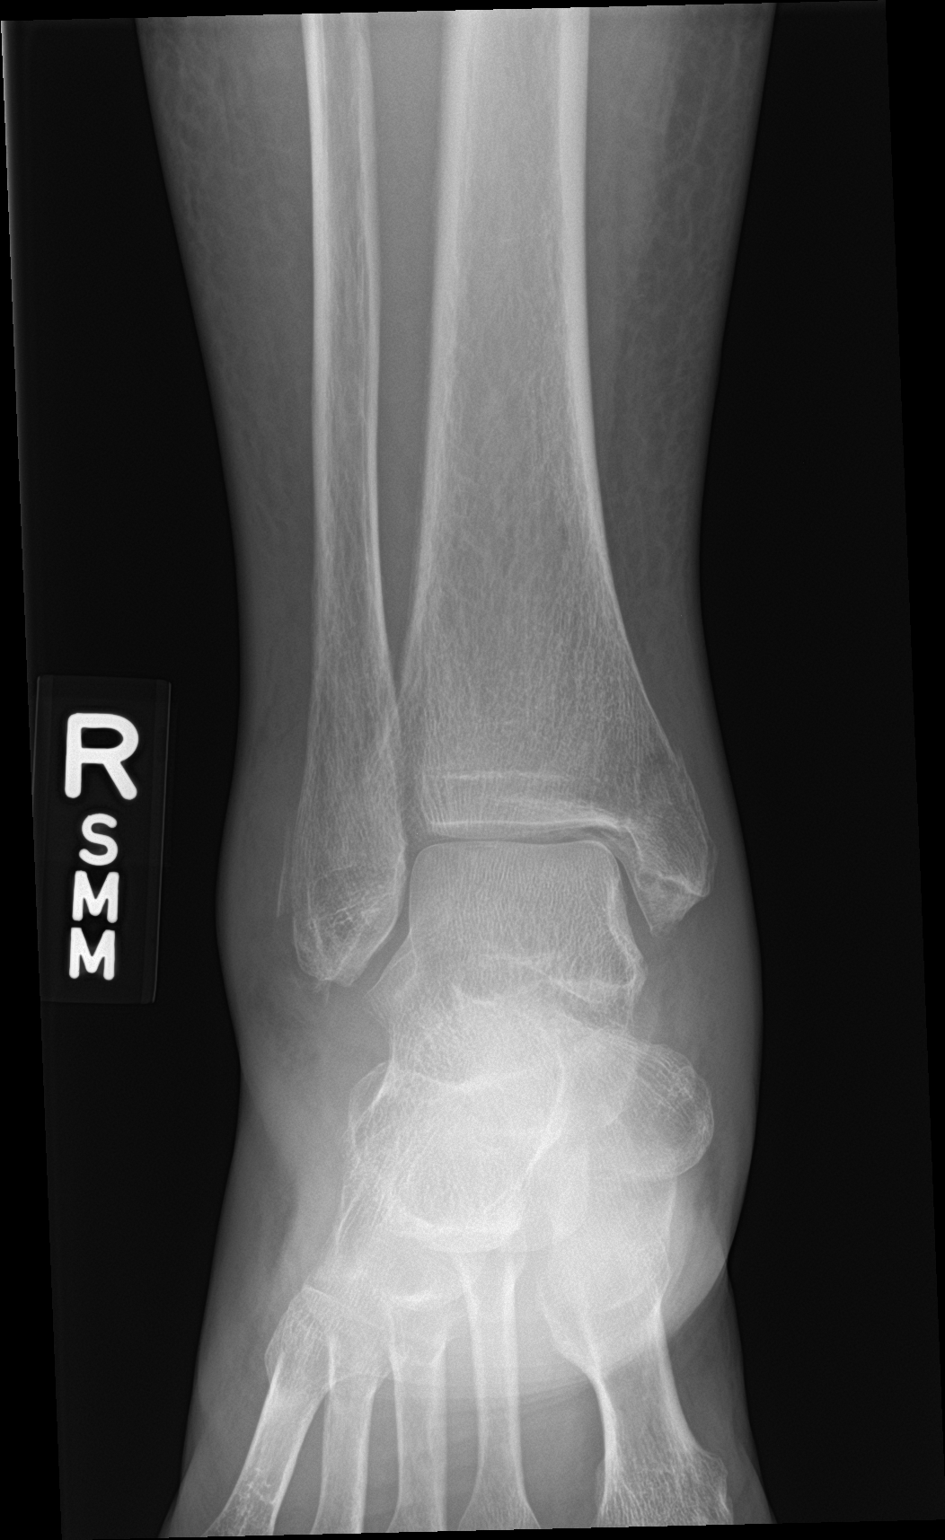

[ankle obl]
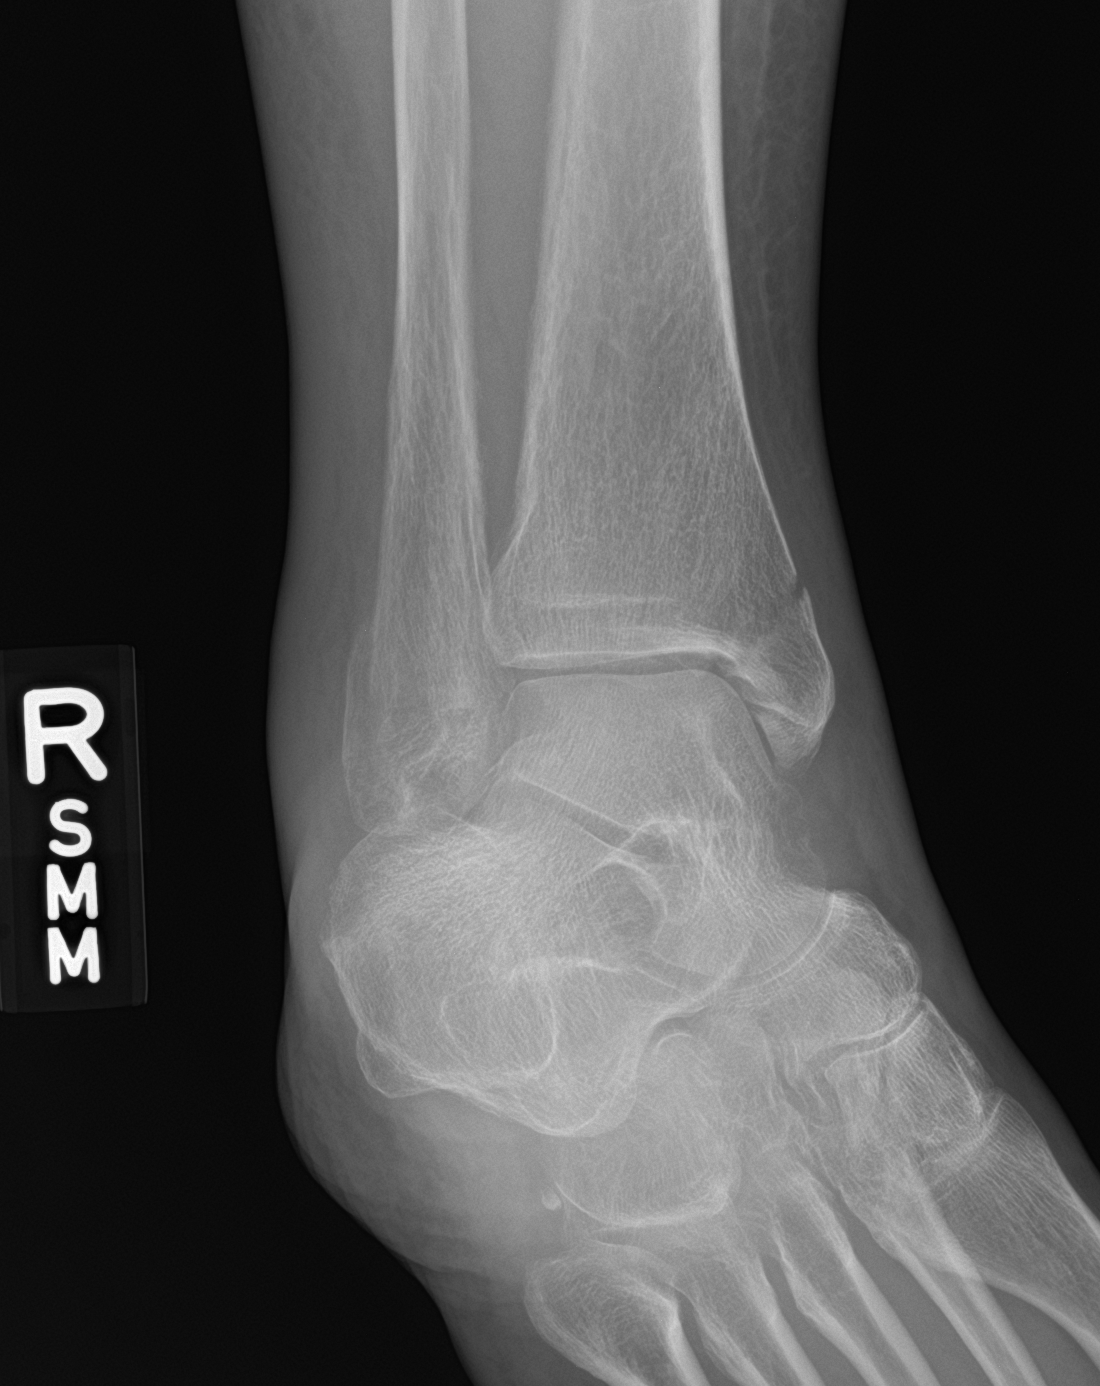

[ankle lat]
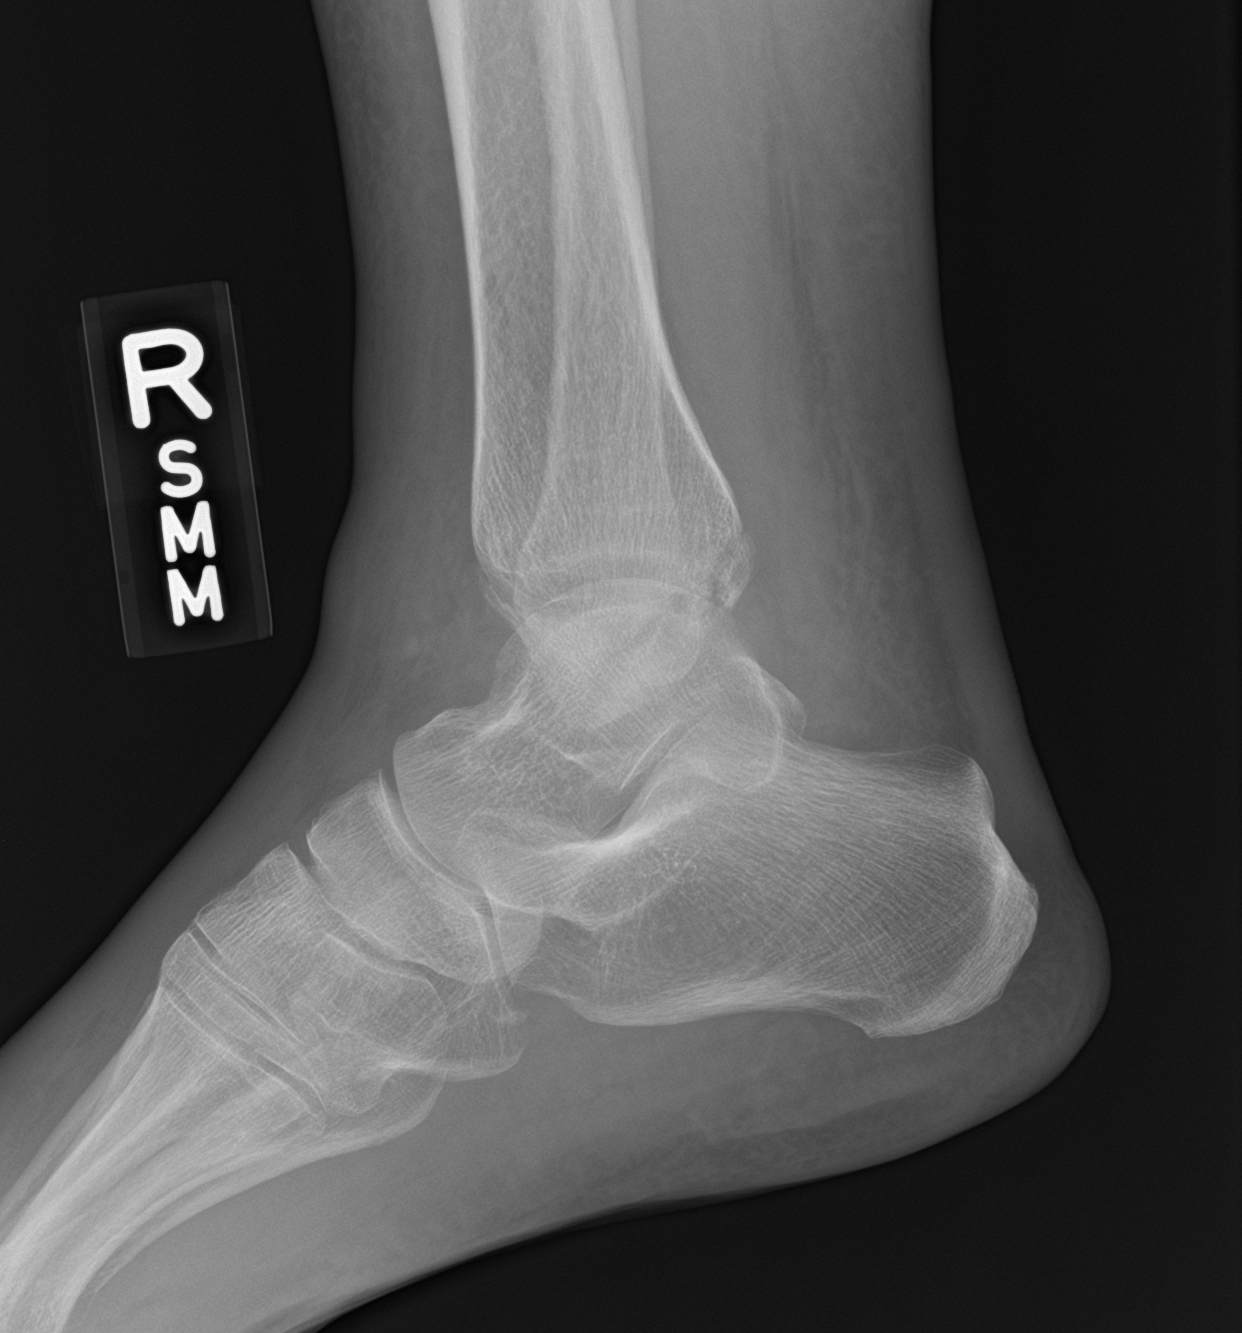

[3 of 3 positions shown; findings below may reference images not displayed]

FINDINGS: Minimally displaced trimalleolar ankle fractures, all of which
appear to demonstrate intra-articular extension. No significant
displacement. Small expected joint effusion. Expected adjacent soft
tissue swelling. No radiopaque foreign body. Note is made is of a
small os peroneus.
IMPRESSION: Minimally displaced trimalleolar ankle fracture with intra-articular
extension.

## 2019-03-30 ENCOUNTER — Other Ambulatory Visit: Payer: Self-pay

## 2019-03-30 ENCOUNTER — Encounter (HOSPITAL_COMMUNITY): Payer: Self-pay

## 2019-03-30 ENCOUNTER — Ambulatory Visit (INDEPENDENT_AMBULATORY_CARE_PROVIDER_SITE_OTHER)
Admission: EM | Admit: 2019-03-30 | Discharge: 2019-03-30 | Disposition: A | Discharge status: Home / Self Care | Payer: BC Managed Care – PPO | Source: Home / Self Care

## 2019-03-30 ENCOUNTER — Encounter (HOSPITAL_COMMUNITY): Payer: Self-pay | Admitting: Emergency Medicine

## 2019-03-30 ENCOUNTER — Emergency Department (HOSPITAL_COMMUNITY): Payer: BC Managed Care – PPO

## 2019-03-30 ENCOUNTER — Emergency Department (HOSPITAL_COMMUNITY)
Admission: EM | Admit: 2019-03-30 | Discharge: 2019-03-31 | Disposition: A | Discharge status: Home / Self Care | Payer: BC Managed Care – PPO | Attending: Emergency Medicine | Admitting: Emergency Medicine

## 2019-03-30 DIAGNOSIS — E8729 Other acidosis: Secondary | ICD-10-CM

## 2019-03-30 DIAGNOSIS — R0789 Other chest pain: Secondary | ICD-10-CM | POA: Insufficient documentation

## 2019-03-30 DIAGNOSIS — R0602 Shortness of breath: Secondary | ICD-10-CM | POA: Diagnosis present

## 2019-03-30 DIAGNOSIS — E872 Acidosis: Secondary | ICD-10-CM | POA: Diagnosis not present

## 2019-03-30 DIAGNOSIS — Z79899 Other long term (current) drug therapy: Secondary | ICD-10-CM | POA: Diagnosis not present

## 2019-03-30 DIAGNOSIS — Z7982 Long term (current) use of aspirin: Secondary | ICD-10-CM | POA: Diagnosis not present

## 2019-03-30 DIAGNOSIS — Z87891 Personal history of nicotine dependence: Secondary | ICD-10-CM | POA: Diagnosis not present

## 2019-03-30 DIAGNOSIS — R110 Nausea: Secondary | ICD-10-CM | POA: Diagnosis not present

## 2019-03-30 DIAGNOSIS — R079 Chest pain, unspecified: Secondary | ICD-10-CM

## 2019-03-30 DIAGNOSIS — R000 Tachycardia, unspecified: Secondary | ICD-10-CM

## 2019-03-30 LAB — CBC
HCT: 41.3 % (ref 36.0–46.0)
Hemoglobin: 13.4 g/dL (ref 12.0–15.0)
MCH: 32 pg (ref 26.0–34.0)
MCHC: 32.4 g/dL (ref 30.0–36.0)
MCV: 98.6 fL (ref 80.0–100.0)
Platelets: 269 10*3/uL (ref 150–400)
RBC: 4.19 MIL/uL (ref 3.87–5.11)
RDW: 13.7 % (ref 11.5–15.5)
WBC: 8.6 10*3/uL (ref 4.0–10.5)
nRBC: 0 % (ref 0.0–0.2)

## 2019-03-30 LAB — BASIC METABOLIC PANEL
Anion gap: 16 — ABNORMAL HIGH (ref 5–15)
BUN: 15 mg/dL (ref 8–23)
CO2: 17 mmol/L — ABNORMAL LOW (ref 22–32)
Calcium: 8.9 mg/dL (ref 8.9–10.3)
Chloride: 103 mmol/L (ref 98–111)
Creatinine, Ser: 0.61 mg/dL (ref 0.44–1.00)
GFR calc Af Amer: >60 mL/min (ref >60)
GFR calc non Af Amer: >60 mL/min (ref >60)
Glucose, Bld: 80 mg/dL (ref 70–99)
Potassium: 3.4 mmol/L — ABNORMAL LOW (ref 3.5–5.1)
Sodium: 136 mmol/L (ref 135–145)

## 2019-03-30 LAB — TROPONIN I (HIGH SENSITIVITY): Troponin I (High Sensitivity): 3 ng/L (ref <18)

## 2019-03-30 MED ORDER — SODIUM CHLORIDE 0.9% FLUSH: 3.0000 mL | Freq: Once | INTRAVENOUS | Status: DC

## 2019-03-30 MED ORDER — SODIUM CHLORIDE 0.9 % IV BOLUS (SEPSIS)
1000.0000 mL | Freq: Once | INTRAVENOUS | Status: AC
  Administered 2019-03-30: 1000 mL via INTRAVENOUS

## 2019-03-30 NOTE — ED Notes (Signed)
Patient is being discharged from the Urgent [place] and sent to the Emergency Department via wheelchair by staff. Per [place], [place], patient is stable but in need of higher level of care due to new onset shortness of breath and tachycardia, EKG shows sinus tachycardia but there is no prior EKG to compare it to. Patient is aware and verbalizes understanding of plan of care.  Vitals:   03/30/19 1916  BP: 136/75  Pulse: (!) 120  Resp: (!) 28  Temp: 98.5 F (36.9 C)  SpO2: 100%

## 2019-03-30 NOTE — ED Provider Notes (Signed)
[facility]   MRN: [id number] DOB: 07/04/1945  Subjective:   Sarah Daniels is a 73 y.o. female presenting for acute onset of worsening severe shortness of breath, heart racing, nausea without vomiting.  Patient states that symptoms started without any warning, known inciting factors.  Denies history of cardiac conditions, history of clot.  Patient has sutures distance well.  Denies known COVID-19 contacts.  Denies taking any chronic medications.  Allergies  Allergen Reactions  . Carbocaine [Mepivacaine Hcl] Hives    As a child (thinks it was hives but not sure)    Past Medical History:  Diagnosis Date  . Trimalleolar fracture of ankle, closed, right, initial encounter      Past Surgical History:  Procedure Laterality Date  . ORIF ANKLE FRACTURE Right 07/28/2016   Procedure: OPEN REDUCTION INTERNAL FIXATION (ORIF) RIGHT TRIMALLEOLAR FRACTURE WITHOUT FIXATION OF THE POSTERIOR LIP;  Surgeon: Teryl Lucy, MD;  Location: [facility];  Service: Orthopedics;  Laterality: Right;  . ROOT CANAL      Family History  Problem Relation Age of Onset  . Healthy Mother   . Healthy Father     Social History   Tobacco Use  . Smoking status: Never Smoker  . Smokeless tobacco: Never Used  Substance Use Topics  . Alcohol use: Yes    Comment: drinks wine daily with dinner  . Drug use: No    Review of Systems  Constitutional: Negative for fever and malaise/fatigue.  HENT: Negative for congestion, ear pain, sinus pain and sore throat.   Eyes: Negative for blurred vision, double vision, discharge and redness.  Respiratory: Positive for shortness of breath. Negative for cough, hemoptysis and wheezing.   Cardiovascular: Negative for chest pain.  Gastrointestinal: Positive for nausea. Negative for abdominal pain, diarrhea and vomiting.  Genitourinary: Negative for dysuria, flank pain and hematuria.  Musculoskeletal: Negative for myalgias.  Skin: Negative for  rash.  Neurological: Positive for dizziness. Negative for speech change, focal weakness, weakness and headaches.  Psychiatric/Behavioral: Negative for depression and substance abuse. The patient is nervous/anxious.      Objective:   Vitals: BP 136/75 (BP Location: Right Arm)   Pulse (!) 120   Temp 98.5 F (36.9 C) (Oral)   Resp (!) 28   SpO2 100%   Physical Exam Constitutional:      General: She is in acute distress.     Appearance: She is well-developed. She is ill-appearing. She is not toxic-appearing or diaphoretic.  HENT:     Head: Normocephalic and atraumatic.     Nose: Nose normal.     Mouth/Throat:     Mouth: Mucous membranes are moist.     Pharynx: Oropharynx is clear. No oropharyngeal exudate or posterior oropharyngeal erythema.  Eyes:     General: No scleral icterus.       Right eye: No discharge.        Left eye: No discharge.     Extraocular Movements: Extraocular movements intact.     Conjunctiva/sclera: Conjunctivae normal.     Pupils: Pupils are equal, round, and reactive to light.  Neck:     Musculoskeletal: Normal range of motion and neck supple. No neck rigidity or muscular tenderness.     Vascular: No carotid bruit.  Cardiovascular:     Rate and Rhythm: Normal rate and regular rhythm.     Pulses: Normal pulses.     Heart sounds: Normal heart sounds. No murmur. No friction rub. No gallop.   Pulmonary:  Effort: Pulmonary effort is normal. No respiratory distress.     Breath sounds: Normal breath sounds. No stridor. No wheezing, rhonchi or rales.  Abdominal:     General: Bowel sounds are normal. There is no distension.     Palpations: Abdomen is soft.     Tenderness: There is no abdominal tenderness. There is no right CVA tenderness, left CVA tenderness, guarding or rebound.  Musculoskeletal:        General: No swelling.     Right lower leg: No edema.     Left lower leg: No edema.  Lymphadenopathy:     Cervical: No cervical adenopathy.  Skin:     General: Skin is warm and dry.     Findings: No rash.  Neurological:     Mental Status: She is alert and oriented to person, place, and time.     Cranial Nerves: No cranial nerve deficit.     Motor: No weakness.     Coordination: Coordination normal.     Gait: Gait normal.     Deep Tendon Reflexes: Reflexes normal.  Psychiatric:        Mood and Affect: Mood is anxious.        Behavior: Behavior normal.        Thought Content: Thought content normal.        Judgment: Judgment normal.     ED ECG REPORT   Date: 03/30/2019  Rate: 116bpm  Rhythm: sinus tachycardia  QRS Axis: normal  Intervals: normal  ST/T Wave abnormalities: nonspecific T wave changes  Conduction Disutrbances:none  Narrative Interpretation: Sinus tachycardia at 116 bpm, nonspecific T wave flattening in lead I, aVL, V5, V6.  Old EKG Reviewed: none available  I have personally reviewed the EKG tracing and agree with the computerized printout as noted.   Assessment and Plan :   1. Shortness of breath   2. Tachycardia   3. Nausea without vomiting     Patient redirected to the emergency room as she will need work-up for possible chest PE, ACS, severe COVID-19.  Patient verbalized understanding and is in agreement with treatment plan.   Jaynee Eagles, [place] 03/30/19 1941

## 2019-03-30 NOTE — ED Notes (Signed)
RN Mitzi Hansen to draw labs when he starts a IV

## 2019-03-30 NOTE — ED Provider Notes (Signed)
TIME SEEN: 11:23 PM  CHIEF COMPLAINT: shortness of breath  HPI: Patient is a 73 y.o. F with no PMH with previous h/o tobacco use (quit in 1970s) who presents to ED with subtle feeling of "feeling off".  Breathing was "different" and "labored" and "alarming".  Felt SOB, nausea, dizzy.  No vertigo.  Describes more as a lightheaded feeling.  Was at office at [facility] when symptoms started.  Symptoms occurred at rest.  Occurred at 1pm.  Symptoms lasted 45 minutes - 1 hour.  Resolved spontaneously.  Felt feverish but did not check temp or take meds PTA.  Symptoms returned while at rest at home at 6pm.  Stronger this time and more SOB.    Symptoms gone now.  No chest pain but had pressure that was very mild.    No A/A factors.  No chills, V/D, cough., sore throat.  No COVID exposures.  No history of PE, DVT, exogenous estrogen use, recent fractures, surgery, trauma, hospitalization or prolonged travel. No lower extremity swelling or pain. No calf tenderness.  Sent from urgent care for tachycardia.  States her only medication is prn ASA.   ROS: See HPI Constitutional: subjective fever  Eyes: no drainage  ENT: no runny nose   Cardiovascular:   chest pain  Resp:  SOB  GI: no vomiting GU: no dysuria Integumentary: no rash  Allergy: no hives  Musculoskeletal: no leg swelling  Neurological: no slurred speech ROS otherwise negative  PAST MEDICAL HISTORY/PAST SURGICAL HISTORY:  Past Medical History:  Diagnosis Date  . Trimalleolar fracture of ankle, closed, right, initial encounter     MEDICATIONS:  Prior to Admission medications   Medication Sig Start Date End Date Taking? Authorizing Provider  aspirin 325 MG tablet Take 325 mg by mouth daily.    [provider]  ondansetron (ZOFRAN) 4 MG tablet Take 1 tablet (4 mg total) by mouth every 8 (eight) hours as needed for nausea or vomiting. 07/28/16   Marchia Bond, MD  oxyCODONE-acetaminophen (ROXICET) 5-325 MG tablet Take 1-2  tablets by mouth every 6 (six) hours as needed for severe pain. 07/28/16   Marchia Bond, MD  sennosides-docusate sodium (SENOKOT-S) 8.6-50 MG tablet Take 2 tablets by mouth daily. 07/28/16   Marchia Bond, MD    ALLERGIES:  Allergies  Allergen Reactions  . Carbocaine [Mepivacaine Hcl] Hives    As a child (thinks it was hives but not sure)    SOCIAL HISTORY:  Social History   Tobacco Use  . Smoking status: Never Smoker  . Smokeless tobacco: Never Used  Substance Use Topics  . Alcohol use: Yes    Comment: drinks wine daily with dinner    FAMILY HISTORY: Family History  Problem Relation Age of Onset  . Healthy Mother   . Healthy Father     EXAM: BP 121/73 (BP Location: Left Arm)   Pulse (!) 104   Temp 98 F (36.7 C) (Oral)   Resp 20   Ht 5\' 4"  (1.626 m)   Wt 61 kg   SpO2 92%   BMI 23.08 kg/m  CONSTITUTIONAL: Alert and oriented and responds appropriately to questions. Well-appearing; well-nourished HEAD: Normocephalic EYES: Conjunctivae clear, pupils appear equal, EOM appear intact ENT: normal nose; moist mucous membranes NECK: Supple, normal ROM CARD: RRR; S1 and S2 appreciated; no murmurs, no clicks, no rubs, no gallops RESP: Normal chest excursion without splinting or tachypnea; breath sounds clear and equal bilaterally; no wheezes, no rhonchi, no rales, no hypoxia or respiratory distress,  speaking full sentences ABD/GI: Normal bowel sounds; non-distended; soft, non-tender, no rebound, no guarding, no peritoneal signs, no hepatosplenomegaly BACK:  The back appears normal EXT: Normal ROM in all joints; no deformity noted, no edema; no cyanosis SKIN: Normal color for age and race; warm; no rash on exposed skin NEURO: Moves all extremities equally PSYCH: The patient's mood and manner are appropriate.   MEDICAL DECISION MAKING: Patient here with symptoms of very mild chest pressure, shortness of breath, lightheadedness, nausea.  Symptoms have now resolved.  EKG shows  no ischemic abnormality.  First troponin negative.  Patient initially tachycardic and tachypneic but this has improved.  Will obtain D-dimer to rule out PE.  No risk factors for PE other than age.  She reports feeling "feverish" but no other infectious symptoms.  Chest x-ray today is clear.  She does have an elevated anion gap metabolic acidosis with normal glucose.  Will check lactate and hydrate patient.  Second troponin pending.  ED PROGRESS: Repeat troponin negative.  D-dimer negative.  Lactate normal.  Salicylate level negative.  Patient still asymptomatic, hemodynamically stable.  Received a liter of IV fluids in the ED.  HEART score 2 and she has had 2 negative high-sensitivity troponins.  I feel she is safe for discharge home with close outpatient follow-up.  Discussed return precautions.   At this time, I do not feel there is any life-threatening condition present. I have reviewed, interpreted and discussed all results (EKG, imaging, lab, urine as appropriate) and exam findings with patient/family. I have reviewed nursing notes and appropriate previous records.  I feel the patient is safe to be discharged home without further emergent workup and can continue workup as an outpatient as needed. Discussed usual and customary return precautions. Patient/family verbalize understanding and are comfortable with this plan.  Outpatient follow-up has been provided as needed. All questions have been answered.    EKG Interpretation  Date/Time:  Friday March 30 2019 23:36:47 EST Ventricular Rate:  84 PR Interval:    QRS Duration: 101 QT Interval:  393 QTC Calculation: 465 R Axis:   58 Text Interpretation: Sinus rhythm Ventricular premature complex No significant change since last tracing other than rate is slower Confirmed by Rochele Raring [phone]) on 03/30/2019 11:44:35 PM        Willow Ora was evaluated in Emergency Department on 03/30/2019 for the symptoms described in the history of  present illness. She was evaluated in the context of the global COVID-19 pandemic, which necessitated consideration that the patient might be at risk for infection with the SARS-CoV-2 virus that causes COVID-19. Institutional protocols and algorithms that pertain to the evaluation of patients at risk for COVID-19 are in a state of rapid change based on information released by regulatory bodies including the CDC and federal and state organizations. These policies and algorithms were followed during the patient's care in the ED.  Patient was seen wearing N95, face shield, gloves.     , Layla Maw, DO 03/31/19 [phone]

## 2019-03-30 NOTE — ED Triage Notes (Signed)
Patient presents to Urgent Care with complaints of shortness of breath, tachycardia, and nausea since about an hour ago. Patient reports no medical hx.

## 2019-03-30 NOTE — ED Triage Notes (Signed)
Pt c/o feeling poorly intermittently all day, feeling "off", +nausea, +dizziness, +shob. Denies CP.

## 2019-03-31 LAB — URINALYSIS, ROUTINE W REFLEX MICROSCOPIC
Bilirubin Urine: NEGATIVE
Glucose, UA: NEGATIVE mg/dL
Hgb urine dipstick: NEGATIVE
Ketones, ur: 5 mg/dL — AB
Nitrite: NEGATIVE
Protein, ur: NEGATIVE mg/dL
Specific Gravity, Urine: 1.011 (ref 1.005–1.030)
pH: 6 (ref 5.0–8.0)

## 2019-03-31 LAB — D-DIMER, QUANTITATIVE (NOT AT ARMC): D-Dimer, Quant: 0.32 ug/mL-FEU (ref 0.00–0.50)

## 2019-03-31 LAB — TROPONIN I (HIGH SENSITIVITY): Troponin I (High Sensitivity): 5 ng/L (ref <18)

## 2019-03-31 LAB — SALICYLATE LEVEL: Salicylate Lvl: <7 mg/dL (ref 2.8–30.0)

## 2019-03-31 LAB — LACTIC ACID, PLASMA: Lactic Acid, Venous: 0.9 mmol/L (ref 0.5–1.9)

## 2019-03-31 NOTE — Discharge Instructions (Addendum)
Your cardiac labs today were normal.  You had a blood test called a D-dimer that was negative that is used to rule out blood clots.  Your chest x-ray was clear.  Urine showed no sign of infection or significant dehydration.  You had a mild metabolic acidosis which we have given you a liter of IV fluids for.  I recommend having your labs rechecked by your primary care physician in 1 week.   Steps to find a Primary Care Provider (PCP):  Call [phone] or [phone] to access "[facility] Service."  2.  You may also go on the [facility] website at [email or web address]  3.  [facility] also frequently accepts new patients.  [place] [phone]  4.  There are also multiple [facility], [facility] and [facility] practices throughout the [place] that are frequently accepting new patients. You may find a clinic that is close to your home and contact them.  [facility] [email or web address] [phone]  [facility] [email or web address]  [facility] [email or web address] [phone]  5.  Local Health Departments also can provide primary care services.  [facility]  [place] [phone]  [facility] [place] [address] [place] [facility] [place] [phone]

## 2019-06-01 ENCOUNTER — Ambulatory Visit: Payer: BC Managed Care – PPO

## 2019-06-17 ENCOUNTER — Ambulatory Visit: Payer: BC Managed Care – PPO | Attending: Internal Medicine

## 2019-06-17 DIAGNOSIS — Z23 Encounter for immunization: Secondary | ICD-10-CM

## 2019-06-17 NOTE — Progress Notes (Signed)
   Covid-19 Vaccination Clinic  Name:  Sarah Daniels    MRN: [id number] DOB: 25-Aug-1945  06/17/2019  [name] was observed post Covid-19 immunization for 15 minutes without incidence. She was provided with Vaccine Information Sheet and instruction to access the V-Safe system.   [name] was instructed to call 911 with any severe reactions post vaccine: Marland Kitchen Difficulty breathing  . Swelling of your face and throat  . A fast heartbeat  . A bad rash all over your body  . Dizziness and weakness    Immunizations Administered    Name Date Dose VIS Date Route   Pfizer COVID-19 Vaccine 06/17/2019  8:28 AM 0.3 mL 04/20/2019 Intramuscular   Manufacturer: [employer], [employer]   Lot: [id number]   NDC: [address]-1

## 2019-07-11 ENCOUNTER — Ambulatory Visit: Payer: BC Managed Care – PPO | Attending: Internal Medicine

## 2019-07-11 DIAGNOSIS — Z23 Encounter for immunization: Secondary | ICD-10-CM

## 2019-07-11 NOTE — Progress Notes (Signed)
   Covid-19 Vaccination Clinic  Name:  MEHA VIDRINE    MRN: [id number] DOB: 02/17/1946  07/11/2019  Ms. Beitz was observed post Covid-19 immunization for 15 minutes without incident. She was provided with Vaccine Information Sheet and instruction to access the V-Safe system.   Ms. Judice was instructed to call 911 with any severe reactions post vaccine: Marland Kitchen Difficulty breathing  . Swelling of face and throat  . A fast heartbeat  . A bad rash all over body  . Dizziness and weakness   Immunizations Administered    Name Date Dose VIS Date Route   Pfizer COVID-19 Vaccine 07/11/2019  3:43 PM 0.3 mL 04/20/2019 Intramuscular   Manufacturer: [employer], [employer]   Lot: [id number]   NDC: [address]-2

## 2021-08-24 IMAGING — DX DG CHEST 2V
2 series · 2 of 2 positions shown · non-contrast
Comparison: None.

CLINICAL DATA: Pt c/o feeling poorly intermittently all day,
feeling "off", +nausea, +dizziness, +shob. Denies CP

EXAM:
CHEST - 2 VIEW

[chest pa]
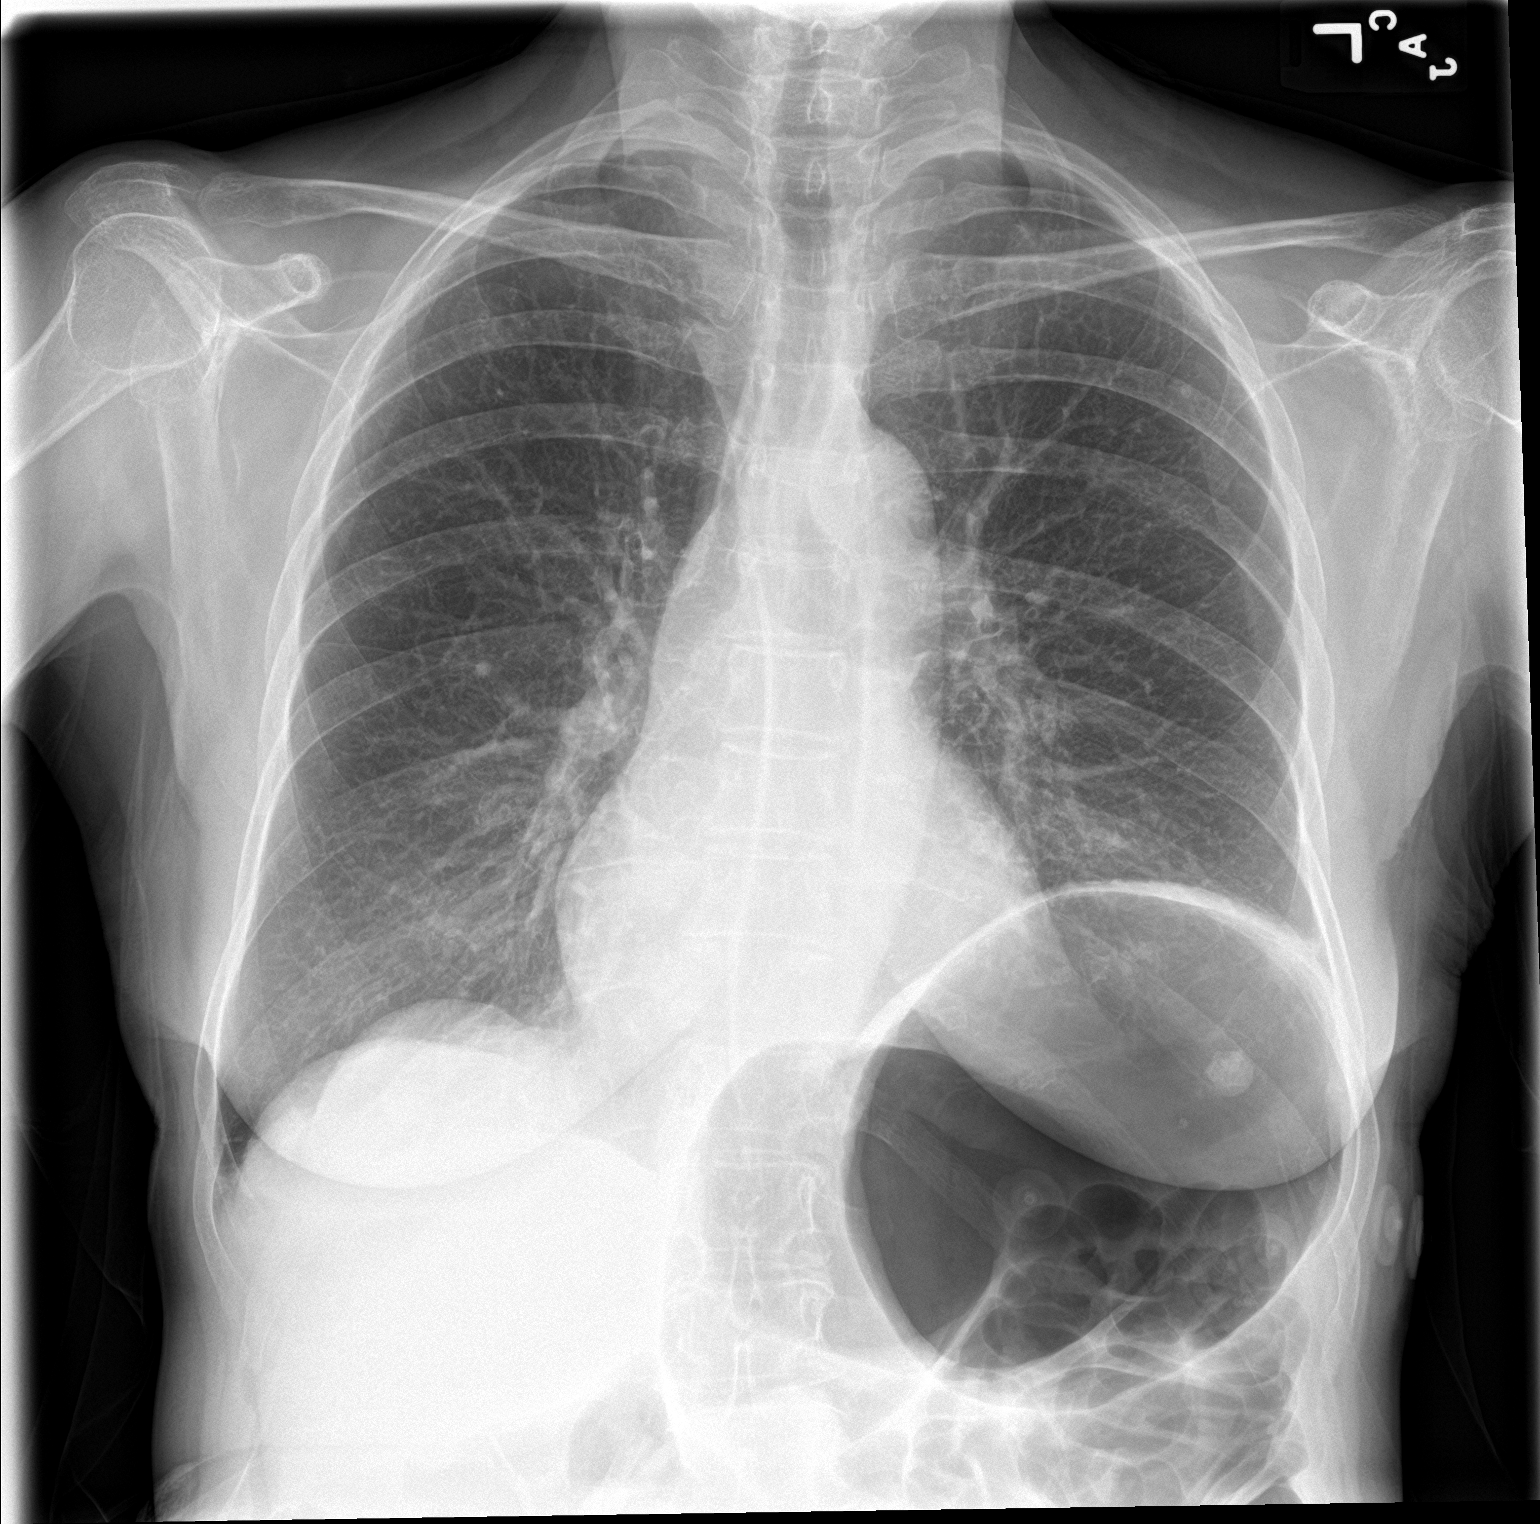

[chest lat]
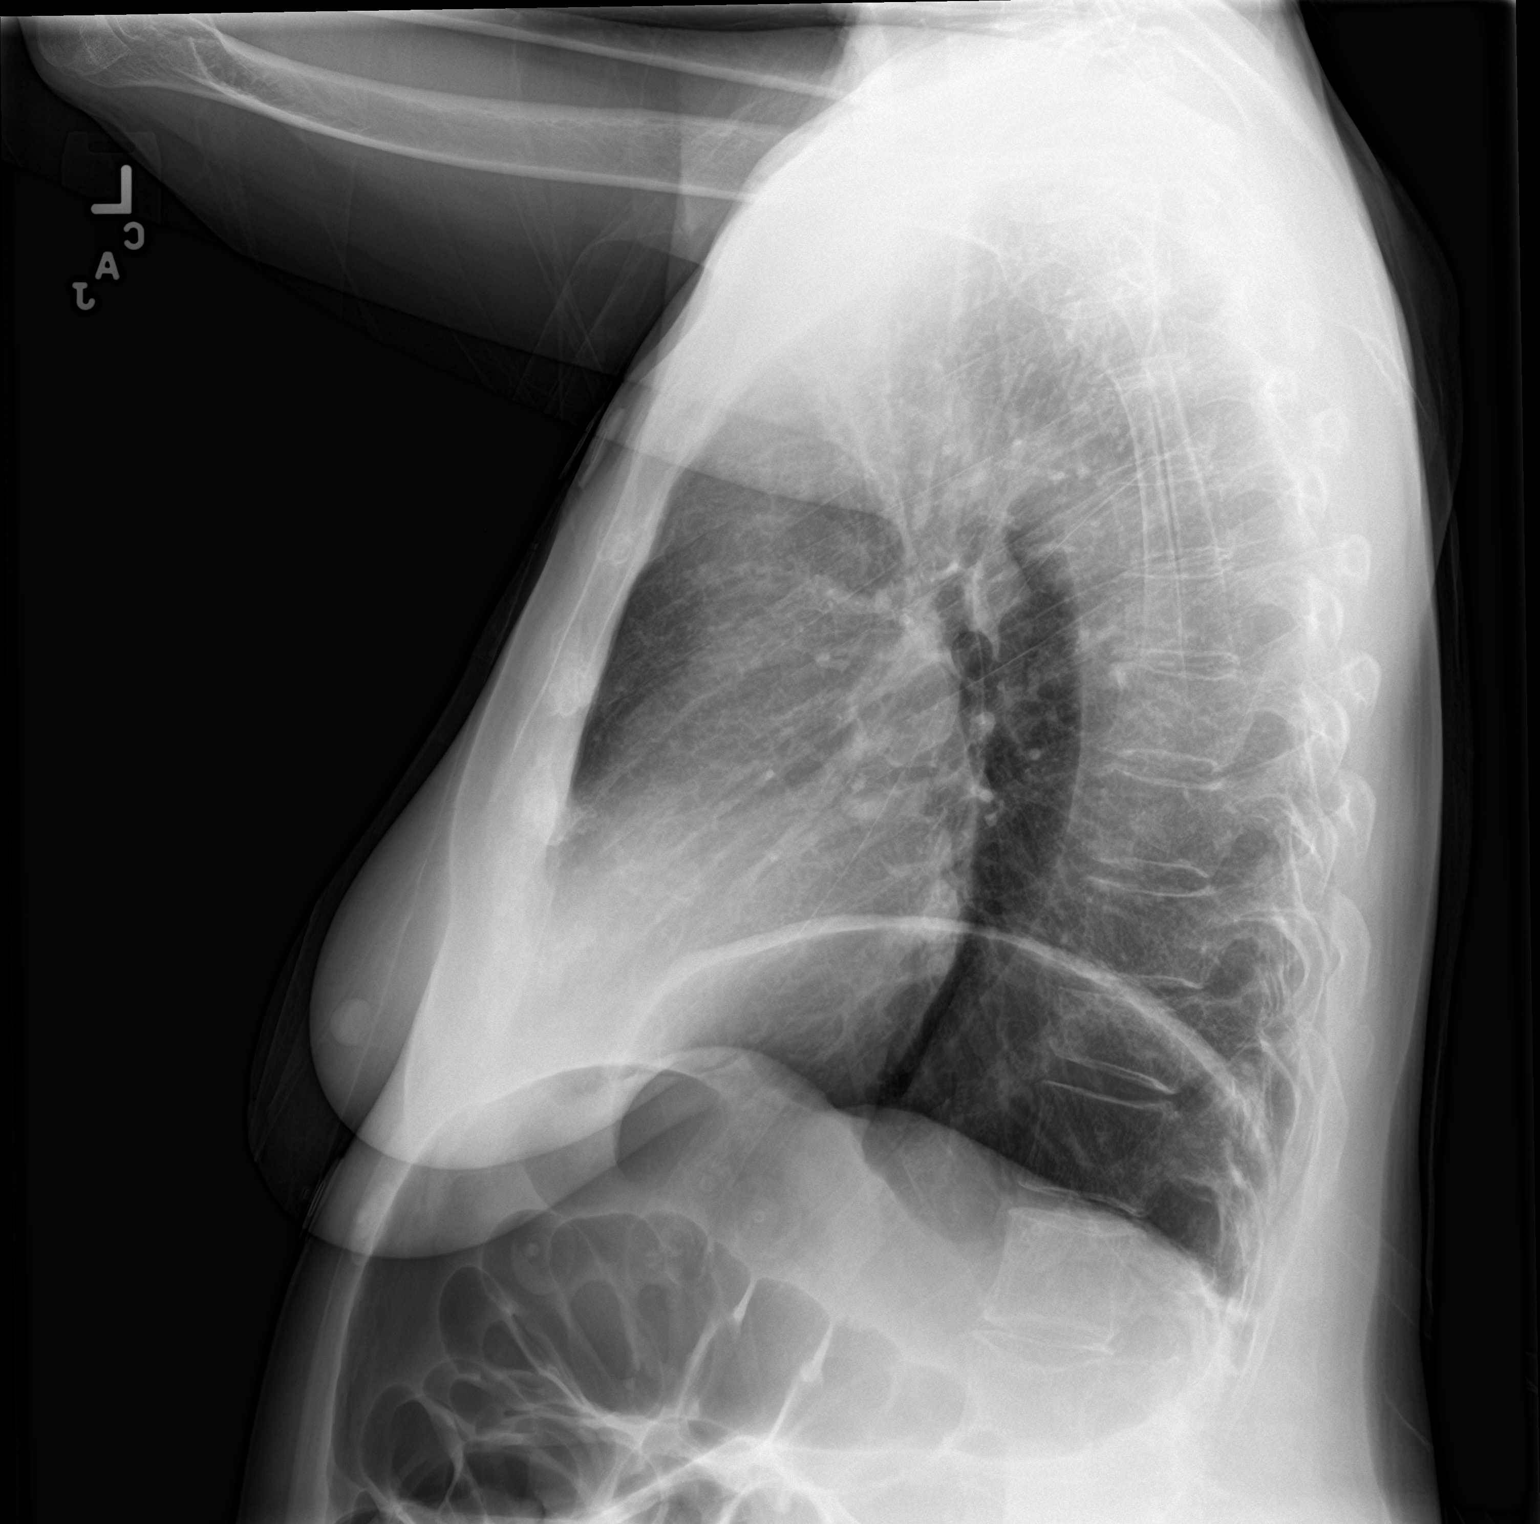

[2 of 2 positions shown; findings below may reference images not displayed]

FINDINGS: Cardiac silhouette normal in size. No mediastinal or hilar masses.
No evidence of adenopathy.

Lungs are clear.  No pleural effusion or pneumothorax.

Stomach is distended with air. There is mild elevation of the left
hemidiaphragm.

Skeletal structures are intact.
IMPRESSION: No active cardiopulmonary disease.

## 2023-01-27 DIAGNOSIS — Z13820 Encounter for screening for osteoporosis: Secondary | ICD-10-CM | POA: Diagnosis not present

## 2023-01-27 DIAGNOSIS — Z1231 Encounter for screening mammogram for malignant neoplasm of breast: Secondary | ICD-10-CM | POA: Diagnosis not present

## 2023-01-27 DIAGNOSIS — Z0000 Encounter for general adult medical examination without abnormal findings: Secondary | ICD-10-CM | POA: Diagnosis not present

## 2023-01-27 DIAGNOSIS — Z136 Encounter for screening for cardiovascular disorders: Secondary | ICD-10-CM | POA: Diagnosis not present

## 2023-01-27 DIAGNOSIS — Z6823 Body mass index (BMI) 23.0-23.9, adult: Secondary | ICD-10-CM | POA: Diagnosis not present

## 2023-01-27 DIAGNOSIS — Z1159 Encounter for screening for other viral diseases: Secondary | ICD-10-CM | POA: Diagnosis not present

## 2023-02-07 DIAGNOSIS — Z1211 Encounter for screening for malignant neoplasm of colon: Secondary | ICD-10-CM | POA: Diagnosis not present

## 2023-02-07 DIAGNOSIS — Z1212 Encounter for screening for malignant neoplasm of rectum: Secondary | ICD-10-CM | POA: Diagnosis not present

## 2023-02-11 DIAGNOSIS — E349 Endocrine disorder, unspecified: Secondary | ICD-10-CM | POA: Diagnosis not present

## 2023-02-11 DIAGNOSIS — E2839 Other primary ovarian failure: Secondary | ICD-10-CM | POA: Diagnosis not present

## 2023-02-11 DIAGNOSIS — M8588 Other specified disorders of bone density and structure, other site: Secondary | ICD-10-CM | POA: Diagnosis not present

## 2023-02-11 DIAGNOSIS — Z1231 Encounter for screening mammogram for malignant neoplasm of breast: Secondary | ICD-10-CM | POA: Diagnosis not present

## 2023-02-11 LAB — COLOGUARD: COLOGUARD: POSITIVE — AB

## 2023-02-11 LAB — HM DEXA SCAN

## 2023-03-07 DIAGNOSIS — R195 Other fecal abnormalities: Secondary | ICD-10-CM | POA: Diagnosis not present

## 2023-03-31 DIAGNOSIS — D128 Benign neoplasm of rectum: Secondary | ICD-10-CM | POA: Diagnosis not present

## 2023-03-31 DIAGNOSIS — R195 Other fecal abnormalities: Secondary | ICD-10-CM | POA: Diagnosis not present

## 2023-04-04 DIAGNOSIS — D128 Benign neoplasm of rectum: Secondary | ICD-10-CM | POA: Diagnosis not present

## 2024-02-16 DIAGNOSIS — Z1231 Encounter for screening mammogram for malignant neoplasm of breast: Secondary | ICD-10-CM | POA: Diagnosis not present

## 2024-03-01 DIAGNOSIS — M810 Age-related osteoporosis without current pathological fracture: Secondary | ICD-10-CM | POA: Diagnosis not present

## 2024-03-01 DIAGNOSIS — Z86018 Personal history of other benign neoplasm: Secondary | ICD-10-CM | POA: Diagnosis not present

## 2024-03-01 DIAGNOSIS — Z6822 Body mass index (BMI) 22.0-22.9, adult: Secondary | ICD-10-CM | POA: Diagnosis not present

## 2024-03-01 DIAGNOSIS — E559 Vitamin D deficiency, unspecified: Secondary | ICD-10-CM | POA: Diagnosis not present

## 2024-03-01 DIAGNOSIS — Z0000 Encounter for general adult medical examination without abnormal findings: Secondary | ICD-10-CM | POA: Diagnosis not present

## 2024-03-14 DIAGNOSIS — E559 Vitamin D deficiency, unspecified: Secondary | ICD-10-CM | POA: Diagnosis not present

## 2024-03-14 DIAGNOSIS — M810 Age-related osteoporosis without current pathological fracture: Secondary | ICD-10-CM | POA: Diagnosis not present

## 2024-04-25 ENCOUNTER — Encounter: Payer: Self-pay | Admitting: Internal Medicine

## 2024-04-25 ENCOUNTER — Non-Acute Institutional Stay: Admitting: Internal Medicine

## 2024-04-25 VITALS — BP 110/68 | HR 78 | Temp 98.5°F | Ht 64.0 in | Wt 125.0 lb

## 2024-04-25 DIAGNOSIS — M810 Age-related osteoporosis without current pathological fracture: Secondary | ICD-10-CM

## 2024-04-25 DIAGNOSIS — Z13220 Encounter for screening for lipoid disorders: Secondary | ICD-10-CM

## 2024-04-25 DIAGNOSIS — E559 Vitamin D deficiency, unspecified: Secondary | ICD-10-CM | POA: Diagnosis not present

## 2024-04-25 DIAGNOSIS — Z130 Encounter for screening for diseases of the blood and blood-forming organs and certain disorders involving the immune mechanism: Secondary | ICD-10-CM | POA: Diagnosis not present

## 2024-04-25 DIAGNOSIS — Z1329 Encounter for screening for other suspected endocrine disorder: Secondary | ICD-10-CM

## 2024-04-25 DIAGNOSIS — Z131 Encounter for screening for diabetes mellitus: Secondary | ICD-10-CM | POA: Diagnosis not present

## 2024-04-25 NOTE — Patient Instructions (Signed)
 Labs will be done on April 30, 2024 at [facility] at 7:45 am

## 2024-04-25 NOTE — Progress Notes (Unsigned)
 Location:  [facility] of Service:  Clinic ([id number])  Provider:   Code Status:  Goals of Care:     07/28/2016   11:08 AM  Advanced Directives  Does Patient Have a Medical Advance Directive? Yes   Type of Advance Directive Living will  Does patient want to make changes to medical advance directive? No - Patient declined      Data saved with a previous flowsheet row definition     Chief Complaint  Patient presents with   Establish Care    HPI: Patient is a 78 y.o. female seen today for medical management of chronic diseases.     . Past Medical History:  Diagnosis Date   Trimalleolar fracture of ankle, closed, right, initial encounter     Past Surgical History:  Procedure Laterality Date   ORIF ANKLE FRACTURE Right 07/28/2016   Procedure: OPEN REDUCTION INTERNAL FIXATION (ORIF) RIGHT TRIMALLEOLAR FRACTURE WITHOUT FIXATION OF THE POSTERIOR LIP;  Surgeon: Fonda Olmsted, MD;  Location: [facility];  Service: Orthopedics;  Laterality: Right;   ROOT CANAL      Allergies[1]  Outpatient Encounter Medications as of 04/25/2024  Medication Sig   aspirin 325 MG tablet Take 325 mg by mouth daily as needed.   [DISCONTINUED] ondansetron  (ZOFRAN ) 4 MG tablet Take 1 tablet (4 mg total) by mouth every 8 (eight) hours as needed for nausea or vomiting. (Patient not taking: Reported on 04/25/2024)   [DISCONTINUED] oxyCODONE -acetaminophen  (ROXICET) 5-325 MG tablet Take 1-2 tablets by mouth every 6 (six) hours as needed for severe pain. (Patient not taking: Reported on 04/25/2024)   [DISCONTINUED] sennosides-docusate sodium  (SENOKOT-S) 8.6-50 MG tablet Take 2 tablets by mouth daily. (Patient not taking: Reported on 04/25/2024)   No facility-administered encounter medications on file as of 04/25/2024.    Review of Systems:  Review of Systems  Constitutional:  Negative for activity change and appetite change.  HENT: Negative.    Respiratory:  Negative for cough  and shortness of breath.   Cardiovascular:  Negative for leg swelling.  Gastrointestinal:  Negative for constipation.  Genitourinary: Negative.   Musculoskeletal:  Negative for arthralgias, gait problem and myalgias.  Skin: Negative.   Neurological:  Negative for dizziness and weakness.  Psychiatric/Behavioral:  Negative for confusion, dysphoric mood and sleep disturbance.     Health Maintenance  Topic Date Due   Hepatitis C Screening  Never done   Bone Density Scan  Never done   Medicare Annual Wellness (AWV)  01/27/2024   DTaP/Tdap/Td (1 - Tdap) 04/25/2025 (Originally 08/26/1964)   Pneumococcal Vaccine: 50+ Years (1 of 1 - PCV) 04/25/2025 (Originally 08/25/1997)   COVID-19 Vaccine (3 - 2025-26 season) 04/25/2025 (Originally 01/09/2024)   Zoster Vaccines- Shingrix (1 of 2) 04/25/2025 (Originally 08/25/1997)   Influenza Vaccine  Completed   Meningococcal B Vaccine  Aged Out   Colonoscopy  Discontinued    Physical Exam: Vitals:   04/25/24 1313  BP: 110/68  Pulse: 78  Temp: 98.5 F (36.9 C)  SpO2: 98%  Weight: 125 lb (56.7 kg)  Height: 5' 4 (1.626 m)   Body mass index is 21.46 kg/m. Physical Exam Vitals reviewed.  Constitutional:      Appearance: Normal appearance.  HENT:     Head: Normocephalic.     Right Ear: Tympanic membrane normal.     Left Ear: Tympanic membrane normal.     Nose: Nose normal.     Mouth/Throat:     Mouth: Mucous  membranes are moist.     Pharynx: Oropharynx is clear.  Eyes:     Pupils: Pupils are equal, round, and reactive to light.  Cardiovascular:     Rate and Rhythm: Normal rate and regular rhythm.     Pulses: Normal pulses.     Heart sounds: Normal heart sounds. No murmur heard. Pulmonary:     Effort: Pulmonary effort is normal.     Breath sounds: Normal breath sounds.  Abdominal:     General: Abdomen is flat. Bowel sounds are normal.     Palpations: Abdomen is soft.  Musculoskeletal:        General: No swelling.     Cervical back:  Neck supple.  Skin:    General: Skin is warm.  Neurological:     General: No focal deficit present.     Mental Status: She is alert and oriented to person, place, and time.  Psychiatric:        Mood and Affect: Mood normal.        Thought Content: Thought content normal.     Labs reviewed: Basic Metabolic Panel: No results for input(s): NA, K, CL, CO2, GLUCOSE, BUN, CREATININE, CALCIUM, MG, PHOS, TSH in the last 8760 hours. Liver Function Tests: No results for input(s): AST, ALT, ALKPHOS, BILITOT, PROT, ALBUMIN in the last 8760 hours. No results for input(s): LIPASE, AMYLASE in the last 8760 hours. No results for input(s): AMMONIA in the last 8760 hours. CBC: No results for input(s): WBC, NEUTROABS, HGB, HCT, MCV, PLT in the last 8760 hours. Lipid Panel: No results for input(s): CHOL, HDL, LDLCALC, TRIG, CHOLHDL, LDLDIRECT in the last 8760 hours. No results found for: HGBA1C  Procedures since last visit: No results found.  Assessment/Plan There are no diagnoses linked to this encounter.   Labs/tests ordered:  * No order type specified * Next appt:  Visit date not found        [1]  Allergies Allergen Reactions   Carbocaine [Mepivacaine Hcl] Hives    As a child (thinks it was hives but not sure)

## 2024-04-30 ENCOUNTER — Telehealth: Payer: Self-pay

## 2024-04-30 NOTE — Telephone Encounter (Signed)
 Can not close office visit encounter

## 2024-05-04 ENCOUNTER — Telehealth: Payer: Self-pay

## 2024-05-04 NOTE — Telephone Encounter (Signed)
 Copied from CRM [id number]. Topic: Clinical - Medication Question >> May 02, 2024 12:43 PM Mercer PEDLAR wrote: Reason for CRM: Patient stated that when she was reviewing her after-visit summary and noticed that there was an error on her list of medications. Patient stated that she was never on the 3 medications listed below and would like them to be taken off her chart.   oxyCODONE -Acetaminophen  5-325 MG Ondansetron  HCl 4 mg Sennosides-Docusate Sodium  8.6-50 MG

## 2024-05-04 NOTE — Telephone Encounter (Signed)
 Current medication list reflect patient taking ASA (aspirin) only.   Those medications (listed below) were marked as not taken and PCP removed once she was closing the note (usually happens at the end of the day), which made them still show on the AVS because at the time of printing they had not been removed.  I tried to call patient to share the above, although she did not answer. If patient calls back [id number] can assure her that her medication list is up to date

## 2024-10-24 ENCOUNTER — Encounter: Admitting: Internal Medicine
